# Patient Record
Sex: Male | Born: 1960 | Race: Black or African American | Hispanic: No | Marital: Single | Smoking: Current every day smoker
Health system: Southern US, Community
[De-identification: ages and names within clinical notes are randomized; demographics above are authoritative.]

## PROBLEM LIST (undated history)

## (undated) DIAGNOSIS — R569 Unspecified convulsions: Secondary | ICD-10-CM

## (undated) DIAGNOSIS — F1010 Alcohol abuse, uncomplicated: Secondary | ICD-10-CM

## (undated) HISTORY — DX: Alcohol abuse, uncomplicated: F10.10

## (undated) HISTORY — DX: Unspecified convulsions: R56.9

---

## 2002-05-29 ENCOUNTER — Encounter: Payer: Self-pay | Admitting: Emergency Medicine

## 2002-05-29 ENCOUNTER — Emergency Department (HOSPITAL_COMMUNITY): Admission: EM | Admit: 2002-05-29 | Discharge: 2002-05-29 | Payer: Self-pay | Admitting: Emergency Medicine

## 2010-09-04 ENCOUNTER — Emergency Department (HOSPITAL_COMMUNITY)
Admission: EM | Admit: 2010-09-04 | Discharge: 2010-09-04 | Payer: Self-pay | Source: Home / Self Care | Admitting: Emergency Medicine

## 2010-10-02 ENCOUNTER — Emergency Department (HOSPITAL_COMMUNITY)
Admission: EM | Admit: 2010-10-02 | Discharge: 2010-10-02 | Payer: Self-pay | Source: Home / Self Care | Admitting: Emergency Medicine

## 2010-10-02 LAB — DIFFERENTIAL
Basophils Absolute: 0.1 10*3/uL (ref 0.0–0.1)
Basophils Relative: 1 % (ref 0–1)
Eosinophils Absolute: 0.1 10*3/uL (ref 0.0–0.7)
Eosinophils Relative: 1 % (ref 0–5)
Lymphocytes Relative: 47 % — ABNORMAL HIGH (ref 12–46)
Lymphs Abs: 2.8 10*3/uL (ref 0.7–4.0)
Monocytes Absolute: 0.3 10*3/uL (ref 0.1–1.0)
Monocytes Relative: 4 % (ref 3–12)
Neutro Abs: 2.9 10*3/uL (ref 1.7–7.7)
Neutrophils Relative %: 47 % (ref 43–77)

## 2010-10-02 LAB — COMPREHENSIVE METABOLIC PANEL
AST: 55 U/L — ABNORMAL HIGH (ref 0–37)
Albumin: 4.2 g/dL (ref 3.5–5.2)
Alkaline Phosphatase: 64 U/L (ref 39–117)
BUN: 6 mg/dL (ref 6–23)
CO2: 23 mEq/L (ref 19–32)
Calcium: 8.8 mg/dL (ref 8.4–10.5)
Chloride: 106 mEq/L (ref 96–112)
Creatinine, Ser: 0.79 mg/dL (ref 0.4–1.5)
GFR calc Af Amer: >60 mL/min (ref >60)
Glucose, Bld: 94 mg/dL (ref 70–99)
Total Protein: 7.7 g/dL (ref 6.0–8.3)

## 2010-10-02 LAB — RPR: RPR Ser Ql: NONREACTIVE

## 2010-10-02 LAB — ETHANOL: Alcohol, Ethyl (B): 288 mg/dL — ABNORMAL HIGH (ref 0–10)

## 2010-10-02 LAB — CBC
HCT: 51.1 % (ref 39.0–52.0)
Hemoglobin: 18.5 g/dL — ABNORMAL HIGH (ref 13.0–17.0)
MCH: 37.1 pg — ABNORMAL HIGH (ref 26.0–34.0)
MCHC: 36.2 g/dL — ABNORMAL HIGH (ref 30.0–36.0)
Platelets: 152 10*3/uL (ref 150–400)
RBC: 4.98 MIL/uL (ref 4.22–5.81)
RDW: 13.6 % (ref 11.5–15.5)
WBC: 6 10*3/uL (ref 4.0–10.5)

## 2010-10-02 LAB — URINE MICROSCOPIC-ADD ON

## 2010-10-02 LAB — URINALYSIS, ROUTINE W REFLEX MICROSCOPIC
Bilirubin Urine: NEGATIVE
Specific Gravity, Urine: 1.012 (ref 1.005–1.030)
Urobilinogen, UA: 0.2 mg/dL (ref 0.0–1.0)

## 2010-10-02 LAB — RAPID URINE DRUG SCREEN, HOSP PERFORMED
Benzodiazepines: NOT DETECTED
Cocaine: NOT DETECTED

## 2012-01-06 ENCOUNTER — Encounter (HOSPITAL_COMMUNITY): Payer: Self-pay | Admitting: *Deleted

## 2012-01-06 ENCOUNTER — Emergency Department (HOSPITAL_COMMUNITY): Payer: Self-pay

## 2012-01-06 ENCOUNTER — Emergency Department (HOSPITAL_COMMUNITY)
Admission: EM | Admit: 2012-01-06 | Discharge: 2012-01-06 | Disposition: A | Discharge status: Home / Self Care | Payer: Self-pay | Attending: Emergency Medicine | Admitting: Emergency Medicine

## 2012-01-06 DIAGNOSIS — N453 Epididymo-orchitis: Secondary | ICD-10-CM | POA: Insufficient documentation

## 2012-01-06 DIAGNOSIS — S0003XA Contusion of scalp, initial encounter: Secondary | ICD-10-CM | POA: Insufficient documentation

## 2012-01-06 DIAGNOSIS — Y9210 Unspecified residential institution as the place of occurrence of the external cause: Secondary | ICD-10-CM | POA: Insufficient documentation

## 2012-01-06 DIAGNOSIS — F10929 Alcohol use, unspecified with intoxication, unspecified: Secondary | ICD-10-CM

## 2012-01-06 DIAGNOSIS — N433 Hydrocele, unspecified: Secondary | ICD-10-CM | POA: Insufficient documentation

## 2012-01-06 DIAGNOSIS — N5089 Other specified disorders of the male genital organs: Secondary | ICD-10-CM | POA: Insufficient documentation

## 2012-01-06 DIAGNOSIS — N451 Epididymitis: Secondary | ICD-10-CM

## 2012-01-06 DIAGNOSIS — W1809XA Striking against other object with subsequent fall, initial encounter: Secondary | ICD-10-CM | POA: Insufficient documentation

## 2012-01-06 DIAGNOSIS — F1010 Alcohol abuse, uncomplicated: Secondary | ICD-10-CM | POA: Insufficient documentation

## 2012-01-06 LAB — URINALYSIS, ROUTINE W REFLEX MICROSCOPIC
Bilirubin Urine: NEGATIVE
Glucose, UA: NEGATIVE mg/dL
Hgb urine dipstick: NEGATIVE
Specific Gravity, Urine: 1.005 (ref 1.005–1.030)
Urobilinogen, UA: 0.2 mg/dL (ref 0.0–1.0)
pH: 6 (ref 5.0–8.0)

## 2012-01-06 LAB — BASIC METABOLIC PANEL
BUN: 11 mg/dL (ref 6–23)
CO2: 19 mEq/L (ref 19–32)
Chloride: 105 mEq/L (ref 96–112)
Creatinine, Ser: 0.55 mg/dL (ref 0.50–1.35)
Potassium: 3.9 mEq/L (ref 3.5–5.1)

## 2012-01-06 LAB — DIFFERENTIAL
Lymphocytes Relative: 31 % (ref 12–46)
Lymphs Abs: 2.2 10*3/uL (ref 0.7–4.0)
Monocytes Absolute: 0.5 10*3/uL (ref 0.1–1.0)
Monocytes Relative: 7 % (ref 3–12)
Neutro Abs: 4.2 10*3/uL (ref 1.7–7.7)

## 2012-01-06 LAB — CBC
HCT: 43.8 % (ref 39.0–52.0)
Hemoglobin: 15.4 g/dL (ref 13.0–17.0)
RBC: 4.18 MIL/uL — ABNORMAL LOW (ref 4.22–5.81)
WBC: 7 10*3/uL (ref 4.0–10.5)

## 2012-01-06 MED ORDER — SODIUM CHLORIDE 0.9 % IV BOLUS (SEPSIS)
1000.0000 mL | Freq: Once | INTRAVENOUS | Status: AC
  Administered 2012-01-06: 1000 mL via INTRAVENOUS

## 2012-01-06 MED ORDER — CIPROFLOXACIN HCL 250 MG PO TABS: 500.0000 mg | ORAL_TABLET | Freq: Two times a day (BID) | ORAL | Status: AC

## 2012-01-06 MED ORDER — CEFTRIAXONE SODIUM 250 MG IJ SOLR
250.0000 mg | Freq: Once | INTRAMUSCULAR | Status: AC
  Administered 2012-01-06: 250 mg via INTRAMUSCULAR
  Filled 2012-01-06: qty 250

## 2012-01-06 NOTE — ED Notes (Signed)
Pt states he is ready to go home. He is attempting to get out of bed. Pt redirected to stay in bed. Pt talking in a pressured voice and is restless.

## 2012-01-06 NOTE — ED Notes (Signed)
Heather, PA, in room speaking with pt. Pt agrees to stay until d/c.

## 2012-01-06 NOTE — ED Notes (Signed)
Pt found lying on the floor next to door in room. Pt alert/oriented, no LOC, pt stated he got out of bed to urinate and fell. Boneta Lucks, RN and [place], [place] assisted to return pt to bed after pt assessed by PA. Pt placed on monitor and IV started. Raised area to left side of pt forehead above brow. Heather V, PA-C came to room to assess pt. Pt denied any pain, dizziness or discomfort.

## 2012-01-06 NOTE — ED Notes (Signed)
Pt given phone to make a phone call. He is on phone using profanity to the other party he is ready to go and he is leaving. Pt agrees to stay 1 hour longer with person on phone.

## 2012-01-06 NOTE — ED Provider Notes (Signed)
History     CSN: [id number]  Arrival date & time 01/06/12  1038   First MD Initiated Contact with Patient 01/06/12 1049      Chief Complaint  Patient presents with  . Groin Swelling    (Consider location/radiation/quality/duration/timing/severity/associated sxs/prior treatment) HPI Comments: Patient comes in today with a chief complaint of swelling of his left scrotum.  He reports that the swelling has been present for the past week and is becoming progressively worse.  He denies dysuria.  Denies fever or chills.  Denies testicular or scrotal pain. Denies any redness of the scrotum.   He reports that he has never had swelling like this before.  PMH significant for gonorrhea and chlamydia.  Patient reports that he is currently sexually active and has unprotected sex.  Patient also reports that he drank a lot of alcohol last evening.    The history is provided by the patient.    History reviewed. No pertinent past medical history.  History reviewed. No pertinent past surgical history.  History reviewed. No pertinent family history.  History  Substance Use Topics  . Smoking status: Current Everyday Smoker  . Smokeless tobacco: Not on file  . Alcohol Use: Yes      Review of Systems  Constitutional: Negative for fever and chills.  Eyes: Negative for visual disturbance.  Respiratory: Negative for shortness of breath.   Cardiovascular: Negative for chest pain.  Gastrointestinal: Negative for nausea and vomiting.  Genitourinary: Positive for scrotal swelling. Negative for dysuria, hematuria, decreased urine volume, penile swelling, penile pain and testicular pain.    Allergies  Milk-related compounds  Home Medications  No current outpatient prescriptions on file.  BP 108/72  Pulse 80  Resp 18  SpO2 98%  Physical Exam  Nursing note and vitals reviewed. Constitutional: He appears well-developed and well-nourished.  HENT:  Head:    Mouth/Throat: Oropharynx is clear  and moist.  Eyes: EOM are normal. Pupils are equal, round, and reactive to light.  Neck: Normal range of motion. Neck supple.  Cardiovascular: Normal rate, regular rhythm and normal heart sounds.   Pulmonary/Chest: Effort normal and breath sounds normal.  Abdominal: Hernia confirmed negative in the right inguinal area and confirmed negative in the left inguinal area.  Genitourinary: Penis normal. Right testis shows no mass, no swelling and no tenderness. Right testis is descended. Left testis shows swelling. Left testis shows no mass and no tenderness. Left testis is descended. Circumcised. No discharge found.       Diffuse left sided scrotal swelling  Musculoskeletal: Normal range of motion.  Lymphadenopathy:       Right: No inguinal adenopathy present.       Left: No inguinal adenopathy present.  Neurological: He is alert.  Skin: Skin is warm and dry.  Psychiatric: He has a normal mood and affect.    ED Course  Procedures (including critical care time)  Labs Reviewed  ETHANOL - Abnormal; Notable for the following:    Alcohol, Ethyl (B) 379 (*)    All other components within normal limits  URINALYSIS, ROUTINE W REFLEX MICROSCOPIC - Abnormal; Notable for the following:    Leukocytes, UA TRACE (*)    All other components within normal limits  CBC - Abnormal; Notable for the following:    RBC 4.18 (*)    MCV 104.8 (*)    MCH 36.8 (*)    All other components within normal limits  DIFFERENTIAL  BASIC METABOLIC PANEL  URINE MICROSCOPIC-ADD ON  GC/CHLAMYDIA  PROBE AMP, URINE   US Scrotum  01/06/2012  *RADIOLOGY REPORT*  Clinical Data:  Testicular pain and swelling  SCROTAL ULTRASOUND DOPPLER ULTRASOUND OF THE TESTICLES  Technique: Complete ultrasound examination of the testicles, epididymis, and other scrotal structures was performed.  Color and spectral Doppler ultrasound were also utilized to evaluate blood flow to the testicles.  Comparison:  None  Findings: Both testicles are normal  in size and echotexture with the right testicle measuring 4.1 x 2.1 x 2.9 cm and the left testicle measuring 3415 2.0 x 2.7 cm.  The left epididymis is hypervascular and edematous, consistent with epididymitis.  There is a 6 mm simple cyst on the right epididymis.  There is a moderate sized simple left hydrocele.  There is no evidence of varicocele bilaterally.  In the patient's region of palpable concern superior to the left testicle, there is a 1.8 cm oval solid mass which appears to be partially calcified.  The appearance is not consistent with the lymph node.  Pulsed Doppler interrogation of both testes demonstrates symmetric, low resistance flow bilaterally.  IMPRESSION:  There is no evidence of testicular torsion bilaterally.  Left epididymitis and moderate simple left hydrocele.  There is a 1.8 cm well defined oval, partially calcified nodule superior and anterior to the left testicle which correlates with the patient's area of palpable concern.  This is nonspecific but is likely an old granuloma.  Please correlate clinically and with patient history.  Original Report Authenticated By: Brandon Melnick, M.D.   [place] Art/ven Flow Abd Pelv Doppler  01/06/2012  *RADIOLOGY REPORT*  Clinical Data:  Testicular pain and swelling  SCROTAL ULTRASOUND DOPPLER ULTRASOUND OF THE TESTICLES  Technique: Complete ultrasound examination of the testicles, epididymis, and other scrotal structures was performed.  Color and spectral Doppler ultrasound were also utilized to evaluate blood flow to the testicles.  Comparison:  None  Findings: Both testicles are normal in size and echotexture with the right testicle measuring 4.1 x 2.1 x 2.9 cm and the left testicle measuring 3415 2.0 x 2.7 cm.  The left epididymis is hypervascular and edematous, consistent with epididymitis.  There is a 6 mm simple cyst on the right epididymis.  There is a moderate sized simple left hydrocele.  There is no evidence of varicocele bilaterally.  In the  patient's region of palpable concern superior to the left testicle, there is a 1.8 cm oval solid mass which appears to be partially calcified.  The appearance is not consistent with the lymph node.  Pulsed Doppler interrogation of both testes demonstrates symmetric, low resistance flow bilaterally.  IMPRESSION:  There is no evidence of testicular torsion bilaterally.  Left epididymitis and moderate simple left hydrocele.  There is a 1.8 cm well defined oval, partially calcified nodule superior and anterior to the left testicle which correlates with the patient's area of palpable concern.  This is nonspecific but is likely an old granuloma.  Please correlate clinically and with patient history.  Original Report Authenticated By: Brandon Melnick, M.D.     No diagnosis found.  Prior to my evaluation while patient was in the ED he fell and hit his head.  No LOC.  No vomiting.  Patient with PERRL and intact CN after the fall.  Small hematoma on the forehead.  Full ROM of all extremities.  Patient intoxicated.  Alcohol level 379.  Patient monitored for several hours after the fall.      MDM  Patient comes in today with a  chief complaint of left sided scrotal swelling.  U/S shows epididymitis and Hydrocele.  Patient given referral to Urology.  Patient with risks factors for STD's.  Patient given 250 mg Ceftriaxone IM while in ED.  Due to the increased cost of Doxycycline, the patient was not started on Doxy initially.  Patient was started on Cipro.  GC and Chlamydia pending.  Antibiotic will be switched to Doxycycline if GC and Chlamydia come back positive.  Patient discharged home in the care of his friend.  Friend drove patient home.        Pascal Lux [place], PA-C 01/07/12 1804  Pascal Lux [place], PA-C 01/07/12 1806
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
## 2012-01-06 NOTE — ED Notes (Signed)
Reports having swelling to testicles x 3 days, denies problems urinating. No acute distress noted at triage.

## 2012-01-06 NOTE — ED Notes (Addendum)
Pt in room. States he has "growth on left testicle" x3 days, burning with urination, states he had STD in past. Pt unsteady gait and slurred speech, smells of ETOH and states he has drank a 12 pack over the weekend. Last drink this AM. Pt drove himself to ED.

## 2012-01-07 LAB — GC/CHLAMYDIA PROBE AMP, URINE: Chlamydia, Swab/Urine, PCR: NEGATIVE

## 2012-01-08 NOTE — ED Provider Notes (Signed)
Medical screening examination/treatment/procedure(s) were conducted as a shared visit with non-physician practitioner(s) and myself.  I personally evaluated the patient during the encounter  Pt awake/alert, monitored in the ED without change in mental status, stable for d/c home   Joya Gaskins, MD 01/08/12 [phone]

## 2012-01-26 ENCOUNTER — Encounter (HOSPITAL_COMMUNITY): Payer: Self-pay

## 2012-01-26 ENCOUNTER — Emergency Department (HOSPITAL_COMMUNITY)
Admission: EM | Admit: 2012-01-26 | Discharge: 2012-01-26 | Disposition: A | Discharge status: Home / Self Care | Payer: Self-pay | Attending: Emergency Medicine | Admitting: Emergency Medicine

## 2012-01-26 DIAGNOSIS — H5789 Other specified disorders of eye and adnexa: Secondary | ICD-10-CM | POA: Insufficient documentation

## 2012-01-26 DIAGNOSIS — F17200 Nicotine dependence, unspecified, uncomplicated: Secondary | ICD-10-CM | POA: Insufficient documentation

## 2012-01-26 DIAGNOSIS — H109 Unspecified conjunctivitis: Secondary | ICD-10-CM

## 2012-01-26 MED ORDER — REFRESH P.M. OP OINT: TOPICAL_OINTMENT | OPHTHALMIC | Status: AC | PRN

## 2012-01-26 MED ORDER — FLUORESCEIN SODIUM 1 MG OP STRP
ORAL_STRIP | OPHTHALMIC | Status: AC
  Administered 2012-01-26: 10:00:00
  Filled 2012-01-26: qty 1

## 2012-01-26 MED ORDER — ERYTHROMYCIN 5 MG/GM OP OINT: TOPICAL_OINTMENT | OPHTHALMIC | Status: AC

## 2012-01-26 MED ORDER — TETRACAINE HCL 0.5 % OP SOLN
OPHTHALMIC | Status: AC
  Administered 2012-01-26: 10:00:00
  Filled 2012-01-26: qty 2

## 2012-01-26 NOTE — ED Notes (Signed)
Pt compalins of swelling and drainage to both eyes for 1 week

## 2012-01-26 NOTE — Discharge Instructions (Signed)
Conjunctivitis Conjunctivitis is commonly called "pink eye." Conjunctivitis can be caused by bacterial or viral infection, allergies, or injuries. There is usually redness of the lining of the eye, itching, discomfort, and sometimes discharge. There may be deposits of matter along the eyelids. A viral infection usually causes a watery discharge, while a bacterial infection causes a yellowish, thick discharge. Pink eye is very contagious and spreads by direct contact. You may be given antibiotic eyedrops as part of your treatment. Before using your eye medicine, remove all drainage from the eye by washing gently with warm water and cotton balls. Continue to use the medication until you have awakened 2 mornings in a row without discharge from the eye. Do not rub your eye. This increases the irritation and helps spread infection. Use separate towels from other household members. Wash your hands with soap and water before and after touching your eyes. Use cold compresses to reduce pain and sunglasses to relieve irritation from light. Do not wear contact lenses or wear eye makeup until the infection is gone. SEEK MEDICAL CARE IF:   Your symptoms are not better after 3 days of treatment.   You have increased pain or trouble seeing.   The outer eyelids become very red or swollen.  Document Released: 09/27/2004 Document Revised: 08/09/2011 Document Reviewed: 08/20/2005 [employer] Patient Information 2012 [employer]. 
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
## 2014-09-27 ENCOUNTER — Emergency Department (HOSPITAL_COMMUNITY): Payer: Self-pay

## 2014-09-27 ENCOUNTER — Encounter (HOSPITAL_COMMUNITY): Payer: Self-pay | Admitting: Emergency Medicine

## 2014-09-27 ENCOUNTER — Emergency Department (HOSPITAL_COMMUNITY)
Admission: EM | Admit: 2014-09-27 | Discharge: 2014-09-28 | Disposition: A | Discharge status: Home / Self Care | Payer: Self-pay | Attending: Emergency Medicine | Admitting: Emergency Medicine

## 2014-09-27 DIAGNOSIS — Z720 Tobacco use: Secondary | ICD-10-CM | POA: Insufficient documentation

## 2014-09-27 DIAGNOSIS — R55 Syncope and collapse: Secondary | ICD-10-CM | POA: Insufficient documentation

## 2014-09-27 LAB — CBC WITH DIFFERENTIAL/PLATELET
Basophils Absolute: 0 10*3/uL (ref 0.0–0.1)
Basophils Relative: 1 % (ref 0–1)
EOS ABS: 0.1 10*3/uL (ref 0.0–0.7)
EOS PCT: 1 % (ref 0–5)
HEMATOCRIT: 41.4 % (ref 39.0–52.0)
Hemoglobin: 14.4 g/dL (ref 13.0–17.0)
LYMPHS PCT: 41 % (ref 12–46)
Lymphs Abs: 2.3 10*3/uL (ref 0.7–4.0)
MCH: 35.6 pg — ABNORMAL HIGH (ref 26.0–34.0)
MCHC: 34.8 g/dL (ref 30.0–36.0)
MCV: 102.2 fL — AB (ref 78.0–100.0)
Monocytes Absolute: 0.2 10*3/uL (ref 0.1–1.0)
Monocytes Relative: 3 % (ref 3–12)
Neutro Abs: 3.1 10*3/uL (ref 1.7–7.7)
Neutrophils Relative %: 54 % (ref 43–77)
PLATELETS: 190 10*3/uL (ref 150–400)
RBC: 4.05 MIL/uL — ABNORMAL LOW (ref 4.22–5.81)
RDW: 13.5 % (ref 11.5–15.5)
WBC: 5.7 10*3/uL (ref 4.0–10.5)

## 2014-09-27 LAB — COMPREHENSIVE METABOLIC PANEL
ALBUMIN: 4.4 g/dL (ref 3.5–5.2)
ALT: 14 U/L (ref 0–53)
AST: 22 U/L (ref 0–37)
Alkaline Phosphatase: 48 U/L (ref 39–117)
Anion gap: 10 (ref 5–15)
BUN: <5 mg/dL — ABNORMAL LOW (ref 6–23)
CO2: 25 mmol/L (ref 19–32)
Calcium: 9 mg/dL (ref 8.4–10.5)
Chloride: 106 mmol/L (ref 96–112)
Creatinine, Ser: 0.74 mg/dL (ref 0.50–1.35)
GFR calc non Af Amer: >90 mL/min (ref >90)
GLUCOSE: 91 mg/dL (ref 70–99)
Potassium: 3.9 mmol/L (ref 3.5–5.1)
Sodium: 141 mmol/L (ref 135–145)
Total Bilirubin: 0.3 mg/dL (ref 0.3–1.2)
Total Protein: 7.9 g/dL (ref 6.0–8.3)

## 2014-09-27 MED ORDER — SODIUM CHLORIDE 0.9 % IV BOLUS (SEPSIS)
2000.0000 mL | Freq: Once | INTRAVENOUS | Status: AC
  Administered 2014-09-28: 2000 mL via INTRAVENOUS
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
## 2014-09-27 NOTE — ED Notes (Addendum)
Owner, Chad Golden requesting etoh level and to be contacted at [phone]

## 2014-09-28 DIAGNOSIS — W19XXXA Unspecified fall, initial encounter: Secondary | ICD-10-CM

## 2014-09-28 DIAGNOSIS — F10120 Alcohol abuse with intoxication, uncomplicated: Secondary | ICD-10-CM

## 2014-09-28 DIAGNOSIS — E860 Dehydration: Secondary | ICD-10-CM

## 2014-09-28 LAB — URINALYSIS, ROUTINE W REFLEX MICROSCOPIC
Bilirubin Urine: NEGATIVE
Glucose, UA: NEGATIVE mg/dL
Hgb urine dipstick: NEGATIVE
Ketones, ur: NEGATIVE mg/dL
Leukocytes, UA: NEGATIVE
Nitrite: NEGATIVE
PROTEIN: NEGATIVE mg/dL
Specific Gravity, Urine: 1.004 — ABNORMAL LOW (ref 1.005–1.030)
Urobilinogen, UA: 0.2 mg/dL (ref 0.0–1.0)
pH: 5 (ref 5.0–8.0)

## 2014-09-28 LAB — ETHANOL: Alcohol, Ethyl (B): 304 mg/dL — ABNORMAL HIGH (ref 0–9)

## 2014-09-28 LAB — RAPID URINE DRUG SCREEN, HOSP PERFORMED
Amphetamines: NOT DETECTED
Barbiturates: NOT DETECTED
Benzodiazepines: NOT DETECTED
COCAINE: NOT DETECTED
Opiates: NOT DETECTED
Tetrahydrocannabinol: NOT DETECTED

## 2014-09-28 MED ORDER — SODIUM CHLORIDE 0.9 % IV BOLUS (SEPSIS)
1000.0000 mL | Freq: Once | INTRAVENOUS | Status: AC
  Administered 2014-09-28: 1000 mL via INTRAVENOUS

## 2014-09-28 NOTE — ED Notes (Signed)
Reported bp 86/59 to Dr. Lita Mains. Received 2 L already completed. MD acknowledges, gives verbal order for 1L bolus.
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
## 2014-09-28 NOTE — Discharge Instructions (Signed)
Syncope °Syncope is a medical term for fainting or passing out. This means you lose consciousness and drop to the ground. People are generally unconscious for less than 5 minutes. You may have some muscle twitches for up to 15 seconds before waking up and returning to normal. Syncope occurs more often in older adults, but it can happen to anyone. While most causes of syncope are not dangerous, syncope can be a sign of a serious medical problem. It is important to seek medical care.  °CAUSES  °Syncope is caused by a sudden drop in blood flow to the brain. The specific cause is often not determined. Factors that can bring on syncope include: °· Taking medicines that lower blood pressure. °· Sudden changes in posture, such as standing up quickly. °· Taking more medicine than prescribed. °· Standing in one place for too long. °· Seizure disorders. °· Dehydration and excessive exposure to heat. °· Low blood sugar (hypoglycemia). °· Straining to have a bowel movement. °· Heart disease, irregular heartbeat, or other circulatory problems. °· Fear, emotional distress, seeing blood, or severe pain. °SYMPTOMS  °Right before fainting, you may: °· Feel dizzy or light-headed. °· Feel nauseous. °· See all white or all black in your field of vision. °· Have cold, clammy skin. °DIAGNOSIS  °Your health care provider will ask about your symptoms, perform a physical exam, and perform an electrocardiogram (ECG) to record the electrical activity of your heart. Your health care provider may also perform other heart or blood tests to determine the cause of your syncope which may include: °· Transthoracic echocardiogram (TTE). During echocardiography, sound waves are used to evaluate how blood flows through your heart. °· Transesophageal echocardiogram (TEE). °· Cardiac monitoring. This allows your health care provider to monitor your heart rate and rhythm in real time. °· Holter monitor. This is a portable device that records your  heartbeat and can help diagnose heart arrhythmias. It allows your health care provider to track your heart activity for several days, if needed. °· Stress tests by exercise or by giving medicine that makes the heart beat faster. °TREATMENT  °In most cases, no treatment is needed. Depending on the cause of your syncope, your health care provider may recommend changing or stopping some of your medicines. °HOME CARE INSTRUCTIONS °· Have someone stay with you until you feel stable. °· Do not drive, use machinery, or play sports until your health care provider says it is okay. °· Keep all follow-up appointments as directed by your health care provider. °· Lie down right away if you start feeling like you might faint. Breathe deeply and steadily. Wait until all the symptoms have passed. °· Drink enough fluids to keep your urine clear or pale yellow. °· If you are taking blood pressure or heart medicine, get up slowly and take several minutes to sit and then stand. This can reduce dizziness. °SEEK IMMEDIATE MEDICAL CARE IF:  °· You have a severe headache. °· You have unusual pain in the chest, abdomen, or back. °· You are bleeding from your mouth or rectum, or you have black or tarry stool. °· You have an irregular or very fast heartbeat. °· You have pain with breathing. °· You have repeated fainting or seizure-like jerking during an episode. °· You faint when sitting or lying down. °· You have confusion. °· You have trouble walking. °· You have severe weakness. °· You have vision problems. °If you fainted, call your local emergency services (911 in U.[place] Do not drive   yourself to the hospital.  MAKE SURE YOU:  Understand these instructions.  Will watch your condition.  Will get help right away if you are not doing well or get worse. Document Released: 08/20/2005 Document Revised: 08/25/2013 Document Reviewed: 10/19/2011 [facility] Patient Information 2015 [place], [place]. This information is not intended to replace  advice given to you by your health care provider. Make sure you discuss any questions you have with your health care provider.  Emergency Department Resource Guide 1) Find a Doctor and Pay Out of Pocket Although you won't have to find out who is covered by your insurance plan, it is a good idea to ask around and get recommendations. You will then need to call the office and see if the doctor you have chosen will accept you as a new patient and what types of options they offer for patients who are self-pay. Some doctors offer discounts or will set up payment plans for their patients who do not have insurance, but you will need to ask so you aren't surprised when you get to your appointment.  2) Contact Your Local Health Department Not all health departments have doctors that can see patients for sick visits, but many do, so it is worth a call to see if yours does. If you don't know where your local health department is, you can check in your phone book. The CDC also has a tool to help you locate your state's health department, and many state websites also have listings of all of their local health departments.  3) Find a [facility] If your illness is not likely to be very severe or complicated, you may want to try a walk in clinic. These are popping up all over the country in pharmacies, drugstores, and shopping centers. They're usually staffed by nurse practitioners or physician assistants that have been trained to treat common illnesses and complaints. They're usually fairly quick and inexpensive. However, if you have serious medical issues or chronic medical problems, these are probably not your best option.  No Primary Care Doctor: - Call Health Connect at  [phone] - they can help you locate a primary care doctor that  accepts your insurance, provides certain services, etc. - Physician Referral Service- [phone]  Chronic Pain Problems: Organization         Address  Phone    Notes  [facility]  [phone] Patients need to be referred by their primary care doctor.   Medication Assistance: Organization         Address  Phone   Notes  [facility] Medication [facility] [address] [place], [place] [address] [phone] --Must be a resident of [facility] -- Must have NO insurance coverage whatsoever (no Medicaid/ Medicare, etc.) -- The pt. MUST have a primary care doctor that directs their care regularly and follows them in the community   MedAssist  [phone]   [employer]  [phone]    Agencies that provide inexpensive medical care: Organization         Address  Phone   Notes  [place]  [phone]   [facility]    [phone]   [facility] [place], [place] [address] [phone]   [place] [address] [place] [phone]   Planned Parenthood    [phone]   [facility]    [phone]  [facility] and [place]  [facility] [address], [place] Phone:  [phone], Fax:  [phone] Hours of Operation:  9 am - 6 pm, M-F.  Also accepts Medicaid/Medicare and self-pay.  [facility] for [address], [address], [place] Phone: [phone], Fax: [phone]. Hours of Operation:  8:30 am - 5:30 pm, M-F.  Also accepts Medicaid and self-pay.  [facility] [place] [address], [place] Phone: [phone]   [place], [place], [place] [phone], Ext. [phone] Mondays & Thursdays: 7-9 AM.  First 15 patients are seen on a first come, first serve basis.    [place] Providers:  Organization         Address  Phone   Notes  [facility] [address], [place] [phone] Also accepts self-pay patients.  [facility]  [address], [place]  [phone]   [place], [address], [place] [phone]   [facility] [address] [place] [phone]   Lucianne Lei [address], [place]   [phone] Only accepts [place] Access [place] patients after they have their name applied to their card.   Self-Pay (no insurance) in [facility]:  Organization         Address  Phone   Notes  Sickle Cell Patients, [facility] [place] [phone]   [facility] [place] [phone]   [facility] [place]  [place], [place], [place] [phone]   [facility]/Dr. Osei-Bonsu  [address], [place] or [address], [place] [phone] Phone number for both [place] and [place] locations is the same.  Urgent Medical and [facility] [address], [place] [phone]   [facility] [address], [place] or [address] [phone]   [facility] [address], [place] [phone], phone; [phone], fax Sees patients 1st and 3rd Saturday of every month.  Must not qualify for public or private insurance (i.e. Medicaid, Medicare, [place] Health Choice, Veterans' Benefits)  Household income should be no more than 200% of the poverty level The clinic cannot treat you if you are pregnant or think you are pregnant  Sexually transmitted diseases are not treated at the clinic.    Dental Care: Organization         Address  Phone  Notes  [facility] Department of [facility] [place] [phone] Accepts children up to age 70 who are enrolled in [place] or [place]; pregnant women with a Medicaid card; and children who have  applied for Medicaid or [place] Health Choice, but were declined, whose parents can pay a reduced fee at time of service.  [facility] Department of [facility]  [address], [place] [phone] Accepts children up to age 72 who are enrolled in [place] or [place]; pregnant women with a Medicaid card; and children who have applied for Medicaid or [place] Health Choice, but were declined, whose parents can pay a reduced fee at time of service.  [place]  [phone]  [place] [phone] Patients are seen by appointment only. Walk-ins are not accepted. [place] will see patients 62 years of age and older. Monday - Tuesday (8am-5pm) Most Wednesdays (8:30-5pm) $30 per visit, cash only  [facility] Adult Dental Access PROGRAM  [address], [facility] [phone] Patients are seen by appointment only. Walk-ins are not accepted. [place] will see patients 40 years of age and older. One Wednesday Evening (Monthly: Volunteer Based).  $30 per visit, cash only  [place]  [phone] for adults; Children under age 33, call Graduate Pediatric Dentistry at [phone]. Children aged 75-14, please call [phone] to request a pediatric application.  Dental services are provided in all areas of dental care including fillings, crowns and bridges, complete and partial dentures, implants, gum treatment, root canals, and extractions. Preventive care is also provided. Treatment is provided to both adults and children. Patients are selected via a lottery and there is often a waiting list.   [facility] [address], [place]  [phone] [email or web address]   [facility] [address] [place], [place] [phone], Ext. [phone] Second and Fourth Thursday of each month, opens at 6:30 AM; Clinic ends at 9 AM.  Patients are seen on a first-come first-served basis, and a  limited number are seen during each clinic.   [facility]  [address] Hillard Danker [place], [place] [phone]   Eligibility Requirements You must have lived in [place], [place], or [place] counties for at least the last three months.   You cannot be eligible for state or federal sponsored [employer], including [employer], [place], or [employer].   You generally cannot be eligible for healthcare insurance through your employer.    How to apply: Eligibility screenings are held every Tuesday and Wednesday afternoon from 1:00 pm until 4:00 pm. You do not need an appointment for the interview!  [facility] [address], [place], [place]   Ewa Gentry  [place] Department  [place]  [phone]    Behavioral Health Resources in the Community: Intensive Outpatient Programs Organization         Address  Phone  Notes  [place]. [address], [place], [place] [phone]   [facility]patient [address], [place], [place]   ADS: Alcohol & Drug Svcs [address] [place], [place]   [place] [address]  [place], [place] or [phone]   Substance Abuse Resources Organization         Address  Phone  Notes  Alcohol and Drug Services  7328334124   [place]  [phone]   [facility] [place]   Chinita Pester  [phone]   Residential & Outpatient Substance Abuse Program  [phone]   Psychological Services Organization         Address  Phone  Notes  [facility] [place]  [place]  [phone]   [place] [address], [place] or [phone]    Mobile Crisis Teams Organization          Address  Phone  Notes  [facility], Mobile Crisis Care Unit  [phone]   [facility] Psychotherapeutic Services  [address] [place], [place]  [facility] [address] [place] [phone]    Self-Help/Support Groups Organization         Address  Phone             Notes  [facility]ental [facility] of [place] - variety of support groups  [place] Call for more information  Narcotics Anonymous (NA), [facility] [address], [employer] Story  2 meetings at this location   Special educational needs teacher         Address  Phone  Notes  ASAP Residential Treatment [place],    [place]  [phone]   [facility]  [address] [place] [phone], [place], [place]   [place], [place] [phone] Admissions: 8am-3pm M-F  Incentives Substance [place] [address]    [place], [place] [phone]   [facility] [address] [place], [place], [place]   [facility] [address]  [place], [place]   [facility]ight Programs - Intensive Outpatient [address], [place], [place]   [facility] ([place].,  [place], [place] [phone] or [phone]   [facility] Services (RTS) [address], [place], [facility] Accepts Medicaid  [facility] [place] [address]  [place] [phone] Substance Abuse/Addiction Treatment   [facility] Organization         Address  Phone  Notes  [facility]  [phone]   Domenic Schwab, PhD [address] Arlis Porta [place], [place]   [phone] or [phone]   [facility] [place], [place] [phone]   [facility] [address], [place], [place] [phone] Insurance/Medicaid/sponsorship  through [facility] and Families [address], [address]                                    [place], [place] [phone] [place] [address], [place] [phone]    Dr. Adele Schilder  [phone]   [facility] Dept. 1) [address], [place] 2) [place] 3)  [address], [place] [phone]  [phone]   [place] [phone] or [phone] (After Hours)

## 2014-09-28 NOTE — ED Notes (Signed)
Admitting at bedside 

## 2014-10-01 ENCOUNTER — Emergency Department (HOSPITAL_COMMUNITY)
Admission: EM | Admit: 2014-10-01 | Discharge: 2014-10-01 | Discharge status: Against Medical Advice | Payer: Self-pay | Attending: Emergency Medicine | Admitting: Emergency Medicine

## 2014-10-01 ENCOUNTER — Encounter (HOSPITAL_COMMUNITY): Payer: Self-pay

## 2014-10-01 ENCOUNTER — Emergency Department (HOSPITAL_COMMUNITY): Payer: Self-pay

## 2014-10-01 DIAGNOSIS — R42 Dizziness and giddiness: Secondary | ICD-10-CM | POA: Insufficient documentation

## 2014-10-01 DIAGNOSIS — Z720 Tobacco use: Secondary | ICD-10-CM | POA: Insufficient documentation

## 2014-10-01 DIAGNOSIS — I959 Hypotension, unspecified: Secondary | ICD-10-CM | POA: Insufficient documentation

## 2014-10-01 LAB — BASIC METABOLIC PANEL
ANION GAP: 10 (ref 5–15)
BUN: 10 mg/dL (ref 6–23)
CALCIUM: 9.9 mg/dL (ref 8.4–10.5)
CO2: 25 mmol/L (ref 19–32)
Chloride: 99 mmol/L (ref 96–112)
Creatinine, Ser: 0.83 mg/dL (ref 0.50–1.35)
GFR calc non Af Amer: >90 mL/min (ref >90)
Glucose, Bld: 148 mg/dL — ABNORMAL HIGH (ref 70–99)
Potassium: 4.1 mmol/L (ref 3.5–5.1)
Sodium: 134 mmol/L — ABNORMAL LOW (ref 135–145)

## 2014-10-01 LAB — I-STAT TROPONIN, ED: Troponin i, poc: 0 ng/mL (ref 0.00–0.08)

## 2014-10-01 LAB — CBC
HEMATOCRIT: 42.2 % (ref 39.0–52.0)
Hemoglobin: 15 g/dL (ref 13.0–17.0)
MCH: 35.8 pg — AB (ref 26.0–34.0)
MCHC: 35.5 g/dL (ref 30.0–36.0)
MCV: 100.7 fL — ABNORMAL HIGH (ref 78.0–100.0)
Platelets: 201 10*3/uL (ref 150–400)
RBC: 4.19 MIL/uL — ABNORMAL LOW (ref 4.22–5.81)
RDW: 13.7 % (ref 11.5–15.5)
WBC: 4.8 10*3/uL (ref 4.0–10.5)

## 2014-10-01 NOTE — ED Notes (Signed)
No answer x3

## 2014-10-01 NOTE — ED Notes (Signed)
No answer

## 2014-10-01 NOTE — ED Notes (Signed)
Pt pacing in lobby, no difficulty with gait noted.

## 2014-10-01 NOTE — ED Notes (Signed)
Pt states he thinks he might be having issues with his blood pressure still being low. BP 620 systolic in triage. Pt reports some lightheadedness in triage also since waking up this morning but also having it when he was here on the 26th. Denies any pain. No Nausea or vomiting.

## 2014-10-06 NOTE — ED Provider Notes (Signed)
Patient left without being seen.   Wandra Arthurs, MD 10/06/14 [phone]

## 2014-12-31 ENCOUNTER — Emergency Department (HOSPITAL_COMMUNITY): Payer: No Typology Code available for payment source

## 2014-12-31 ENCOUNTER — Encounter (HOSPITAL_COMMUNITY): Payer: Self-pay | Admitting: Emergency Medicine

## 2014-12-31 ENCOUNTER — Emergency Department (HOSPITAL_COMMUNITY)
Admission: EM | Admit: 2014-12-31 | Discharge: 2014-12-31 | Disposition: A | Discharge status: Home / Self Care | Payer: No Typology Code available for payment source | Attending: Emergency Medicine | Admitting: Emergency Medicine

## 2014-12-31 DIAGNOSIS — S2232XA Fracture of one rib, left side, initial encounter for closed fracture: Secondary | ICD-10-CM

## 2014-12-31 DIAGNOSIS — Y998 Other external cause status: Secondary | ICD-10-CM | POA: Insufficient documentation

## 2014-12-31 DIAGNOSIS — S3991XA Unspecified injury of abdomen, initial encounter: Secondary | ICD-10-CM | POA: Diagnosis not present

## 2014-12-31 DIAGNOSIS — Z720 Tobacco use: Secondary | ICD-10-CM | POA: Diagnosis not present

## 2014-12-31 DIAGNOSIS — Y9389 Activity, other specified: Secondary | ICD-10-CM | POA: Insufficient documentation

## 2014-12-31 DIAGNOSIS — Y92410 Unspecified street and highway as the place of occurrence of the external cause: Secondary | ICD-10-CM | POA: Diagnosis not present

## 2014-12-31 DIAGNOSIS — S2242XA Multiple fractures of ribs, left side, initial encounter for closed fracture: Secondary | ICD-10-CM | POA: Insufficient documentation

## 2014-12-31 DIAGNOSIS — S29001A Unspecified injury of muscle and tendon of front wall of thorax, initial encounter: Secondary | ICD-10-CM | POA: Diagnosis present

## 2014-12-31 LAB — URINALYSIS, ROUTINE W REFLEX MICROSCOPIC
Bilirubin Urine: NEGATIVE
GLUCOSE, UA: NEGATIVE mg/dL
Hgb urine dipstick: NEGATIVE
Ketones, ur: 15 mg/dL — AB
Leukocytes, UA: NEGATIVE
NITRITE: NEGATIVE
Protein, ur: NEGATIVE mg/dL
SPECIFIC GRAVITY, URINE: 1.02 (ref 1.005–1.030)
Urobilinogen, UA: 1 mg/dL (ref 0.0–1.0)
pH: 6.5 (ref 5.0–8.0)

## 2014-12-31 LAB — I-STAT CHEM 8, ED
BUN: 6 mg/dL (ref 6–23)
CHLORIDE: 101 mmol/L (ref 96–112)
Calcium, Ion: 1.19 mmol/L (ref 1.12–1.23)
Creatinine, Ser: 1.1 mg/dL (ref 0.50–1.35)
Glucose, Bld: 91 mg/dL (ref 70–99)
HCT: 48 % (ref 39.0–52.0)
HEMOGLOBIN: 16.3 g/dL (ref 13.0–17.0)
Potassium: 3.9 mmol/L (ref 3.5–5.1)
SODIUM: 139 mmol/L (ref 135–145)
TCO2: 24 mmol/L (ref 0–100)

## 2014-12-31 MED ORDER — ONDANSETRON HCL 4 MG/2ML IJ SOLN
4.0000 mg | Freq: Once | INTRAMUSCULAR | Status: AC
  Administered 2014-12-31: 4 mg via INTRAVENOUS
  Filled 2014-12-31: qty 2

## 2014-12-31 MED ORDER — MORPHINE SULFATE 4 MG/ML IJ SOLN
4.0000 mg | Freq: Once | INTRAMUSCULAR | Status: AC
  Administered 2014-12-31: 4 mg via INTRAVENOUS
  Filled 2014-12-31: qty 1

## 2014-12-31 MED ORDER — OXYCODONE-ACETAMINOPHEN 5-325 MG PO TABS: 1.0000 | ORAL_TABLET | ORAL | Status: DC | PRN

## 2014-12-31 MED ORDER — IOHEXOL 300 MG/ML  SOLN
100.0000 mL | Freq: Once | INTRAMUSCULAR | Status: AC | PRN
  Administered 2014-12-31: 100 mL via INTRAVENOUS

## 2014-12-31 MED ORDER — IBUPROFEN 800 MG PO TABS: 800.0000 mg | ORAL_TABLET | Freq: Three times a day (TID) | ORAL | Status: DC | PRN

## 2014-12-31 MED ORDER — CYCLOBENZAPRINE HCL 10 MG PO TABS
5.0000 mg | ORAL_TABLET | Freq: Once | ORAL | Status: AC
  Administered 2014-12-31: 5 mg via ORAL
  Filled 2014-12-31: qty 1
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
## 2014-12-31 NOTE — ED Notes (Signed)
This RN gave patient incentive spirometer and instructed how to use it. Pt demonstrated correct use.

## 2014-12-31 NOTE — ED Notes (Signed)
Pt A&OX4, ambulatory at d/c with steady gait, NAD 
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
## 2014-12-31 NOTE — ED Notes (Signed)
Pt given incentive spirometer and instructed patient on how to use. Pt demonstrated correct use.

## 2016-11-07 ENCOUNTER — Encounter (HOSPITAL_COMMUNITY): Payer: Self-pay | Admitting: *Deleted

## 2016-11-07 ENCOUNTER — Ambulatory Visit (HOSPITAL_COMMUNITY)
Admission: EM | Admit: 2016-11-07 | Discharge: 2016-11-07 | Disposition: A | Discharge status: Home / Self Care | Payer: BLUE CROSS/BLUE SHIELD | Attending: Family Medicine | Admitting: Family Medicine

## 2016-11-07 DIAGNOSIS — G540 Brachial plexus disorders: Secondary | ICD-10-CM

## 2016-11-07 MED ORDER — IBUPROFEN 800 MG PO TABS: 800.0000 mg | ORAL_TABLET | Freq: Three times a day (TID) | ORAL | 0 refills | Status: DC | PRN

## 2016-11-07 NOTE — ED Provider Notes (Signed)
CSN: [id number]     Arrival date & time 11/07/16  1001 History   First MD Initiated Contact with Patient 11/07/16 1030     Chief Complaint  Patient presents with  . Tingling   (Consider location/radiation/quality/duration/timing/severity/associated sxs/prior Treatment) Patient c/o numbness and tingling in right lateral biceps and entire hand and fingers.  He has been having sx's for about a week.  He has been carrying a back pack on right shoulder.  Patient states he has been taking some advil otc as directed.   The history is provided by the patient.  Muscle Pain  This is a new problem. The current episode started more than 1 week ago. The problem occurs constantly. The problem has not changed since onset.Nothing aggravates the symptoms. Nothing relieves the symptoms.    History reviewed. No pertinent past medical history. History reviewed. No pertinent surgical history. History reviewed. No pertinent family history. Social History  Substance Use Topics  . Smoking status: Current Every Day Smoker  . Smokeless tobacco: Not on file  . Alcohol use Yes    Review of Systems  Constitutional: Negative.   HENT: Negative.   Eyes: Negative.   Respiratory: Negative.   Cardiovascular: Negative.   Gastrointestinal: Negative.   Endocrine: Negative.   Genitourinary: Negative.   Musculoskeletal: Positive for arthralgias.  Allergic/Immunologic: Negative.   Neurological: Positive for numbness.  Hematological: Negative.   Psychiatric/Behavioral: Negative.     Allergies  Milk-related compounds  Home Medications   Prior to Admission medications   Medication Sig Start Date End Date Taking? Authorizing Provider  ibuprofen (ADVIL,MOTRIN) 800 MG tablet Take 1 tablet (800 mg total) by mouth every 8 (eight) hours as needed for mild pain or moderate pain. 11/07/16   Lysbeth Penner, FNP  oxyCODONE-acetaminophen (PERCOCET/ROXICET) 5-325 MG per tablet Take 1-2 tablets by mouth every 4 (four) hours  as needed for moderate pain or severe pain. 12/31/14   Clayton Bibles, PA-C  Tetrahydrozoline HCl (VISINE OP) Apply 1 drop to eye daily as needed (red eyes).    Historical Provider, MD   Meds Ordered and Administered this Visit  Medications - No data to display  BP 124/76 (BP Location: Right Arm)   Pulse 78   Temp 98.6 F (37 C) (Oral)   Resp 18   SpO2 100%  No data found.   Physical Exam  Constitutional: He appears well-developed and well-nourished.  HENT:  Head: Normocephalic and atraumatic.  Right Ear: External ear normal.  Left Ear: External ear normal.  Mouth/Throat: Oropharynx is clear and moist.  Eyes: Conjunctivae and EOM are normal. Pupils are equal, round, and reactive to light.  Neck: Normal range of motion. Neck supple.  Cardiovascular: Normal rate, regular rhythm and normal heart sounds.   Diminished right radial pulse when raising right arm above head.  Pulmonary/Chest: Effort normal.  Musculoskeletal: He exhibits tenderness.  TTP right shoulder.  Patient c/o increased numbness and tingling when raising right arm above his head.  Nursing note and vitals reviewed.   Urgent Care Course     Procedures (including critical care time)  Labs Review Labs Reviewed - No data to display  Imaging Review No results found.   Visual Acuity Review  Right Eye Distance:   Left Eye Distance:   Bilateral Distance:    Right Eye Near:   Left Eye Near:    Bilateral Near:         MDM   1. Thoracic outlet syndrome    Motrin 800mg   one po tid x 10 days #30  Advised patient to take prilosec otc prn if having any GI sx's.    Follow up prn.      Lysbeth Penner, FNP 11/07/16 [phone]

## 2016-11-07 NOTE — ED Triage Notes (Addendum)
Pt reports    Symptoms     Of  Tingling  And  Numbness     To  His r  Arm  That  Started about  1  Week ago     Hand  Grips  Moderate  Pt is  A  Smoker     Pt is  Awake and  Alert  And  Oriented   Denies  Any  Injury

## 2016-11-16 ENCOUNTER — Ambulatory Visit (HOSPITAL_COMMUNITY)
Admission: EM | Admit: 2016-11-16 | Discharge: 2016-11-16 | Disposition: A | Discharge status: Home / Self Care | Payer: BLUE CROSS/BLUE SHIELD | Attending: Internal Medicine | Admitting: Internal Medicine

## 2016-11-16 ENCOUNTER — Encounter (HOSPITAL_COMMUNITY): Payer: Self-pay | Admitting: Emergency Medicine

## 2016-11-16 ENCOUNTER — Ambulatory Visit (INDEPENDENT_AMBULATORY_CARE_PROVIDER_SITE_OTHER): Payer: BLUE CROSS/BLUE SHIELD

## 2016-11-16 DIAGNOSIS — R202 Paresthesia of skin: Secondary | ICD-10-CM

## 2016-11-16 DIAGNOSIS — M5030 Other cervical disc degeneration, unspecified cervical region: Secondary | ICD-10-CM

## 2016-11-16 DIAGNOSIS — D170 Benign lipomatous neoplasm of skin and subcutaneous tissue of head, face and neck: Secondary | ICD-10-CM

## 2016-11-16 DIAGNOSIS — R200 Anesthesia of skin: Secondary | ICD-10-CM

## 2016-11-16 MED ORDER — PREDNISONE 10 MG (21) PO TBPK: ORAL_TABLET | Freq: Every day | ORAL | 0 refills | Status: DC

## 2016-11-16 NOTE — ED Triage Notes (Signed)
The patient presented to the [facility] for a refill on his ibuprofen for his right arm pain that was evaluated here.

## 2016-11-16 NOTE — ED Provider Notes (Signed)
CSN: [id number]     Arrival date & time 11/16/16  1339 History   None    Chief Complaint  Patient presents with  . Medication Refill   (Consider location/radiation/quality/duration/timing/severity/associated sxs/prior Treatment) Patient is here for follow up on his right arm and hand numbness.  He has been having numbness and tingling in his right upper extremity.  He took 10 days of ibuprofen w/o improvement.  He was diagnosed with thoracic outlet syndrome.   The history is provided by the patient.  Medication Refill  Medications/supplies requested:  Ibuprofen Reason for request:  Medications ran out Medications taken before: yes - see home medications     History reviewed. No pertinent past medical history. History reviewed. No pertinent surgical history. History reviewed. No pertinent family history. Social History  Substance Use Topics  . Smoking status: Current Every Day Smoker  . Smokeless tobacco: Not on file  . Alcohol use Yes    Review of Systems  Constitutional: Negative.   HENT: Negative.   Eyes: Negative.   Respiratory: Negative.   Cardiovascular: Negative.   Gastrointestinal: Negative.  Negative for abdominal distention.  Endocrine: Negative.   Genitourinary: Negative.   Musculoskeletal: Positive for arthralgias.  Allergic/Immunologic: Negative.   Neurological: Negative.   Hematological: Negative.   Psychiatric/Behavioral: Negative.     Allergies  Milk-related compounds  Home Medications   Prior to Admission medications   Medication Sig Start Date End Date Taking? Authorizing Provider  ibuprofen (ADVIL,MOTRIN) 800 MG tablet Take 1 tablet (800 mg total) by mouth every 8 (eight) hours as needed for mild pain or moderate pain. 11/07/16  Yes Lysbeth Penner, FNP  predniSONE (STERAPRED UNI-PAK 21 TAB) 10 MG (21) TBPK tablet Take by mouth daily. Take 6 tabs by mouth daily  for 2 days, then 5 tabs for 2 days, then 4 tabs for 2 days, then 3 tabs for 2 days, 2  tabs for 2 days, then 1 tab by mouth daily for 2 days 11/16/16   Lysbeth Penner, FNP   Meds Ordered and Administered this Visit  Medications - No data to display  BP 119/76 (BP Location: Right Arm)   Pulse 64   Temp 98.4 F (36.9 C) (Oral)   Resp 16   SpO2 98%  No data found.   Physical Exam  Constitutional: He appears well-developed and well-nourished.  HENT:  Head: Normocephalic and atraumatic.  Eyes: Conjunctivae and EOM are normal. Pupils are equal, round, and reactive to light.  Neck: Normal range of motion. Neck supple.  Cardiovascular: Normal rate, regular rhythm and normal heart sounds.   Pulmonary/Chest: Effort normal and breath sounds normal.  Nursing note and vitals reviewed.   Urgent Care Course     Procedures (including critical care time)  Labs Review Labs Reviewed - No data to display  Imaging Review Dg Cervical Spine Complete  Result Date: 11/16/2016 CLINICAL DATA:  56 year old male with right arm numbness. Tingling in the right fingers. No known injury. Initial encounter. EXAM: CERVICAL SPINE - COMPLETE 4+ VIEW COMPARISON:  Head CT 09/27/2014. FINDINGS: Normal prevertebral soft tissue contour. Mild straightening of upper cervical lordosis. Cervicothoracic junction alignment is within normal limits. Bilateral posterior element alignment is within normal limits. Normal C1-C2 alignment. Mild levoconvex curvature of the cervical spine. Mild to moderate disc space loss from C4-C5 to C6-C7 with moderate associated endplate spurring. Negative lung apices. Calcified carotid atherosclerosis greater on the left. There is an abnormal soft tissue contour along the right neck near  the expected level of the right carotid bifurcation which projects over the larynx on the lateral view (arrow). There are questionable tiny surgical clips surrounding this area. IMPRESSION: 1. Query palpable right neck mass (image 4). And query also previous right carotid endarterectomy. Neck CT (IV  contrast preferred) could evaluate further as necessary. 2. No acute osseous abnormality in the cervical spine chronic C4-C5, C5-C6, and C6-C7 disc and endplate degeneration. Electronically Signed   By: Genevie Ann M.D.   On: 11/16/2016 15:13     Visual Acuity Review  Right Eye Distance:   Left Eye Distance:   Bilateral Distance:    Right Eye Near:   Left Eye Near:    Bilateral Near:         MDM   1. Numbness and tingling in right hand   2. DDD (degenerative disc disease), cervical   3. Lipoma of neck    Sterapred DS dose pack #42  Explained this is probably coming from his cervical spine. Follow up with PCP.    Lysbeth Penner, FNP 11/16/16 [phone]

## 2016-12-07 ENCOUNTER — Ambulatory Visit (INDEPENDENT_AMBULATORY_CARE_PROVIDER_SITE_OTHER): Payer: BLUE CROSS/BLUE SHIELD | Admitting: Family

## 2016-12-07 ENCOUNTER — Encounter: Payer: Self-pay | Admitting: Family

## 2016-12-07 VITALS — BP 112/80 | HR 84 | Temp 98.5°F | Resp 16 | Ht 66.0 in | Wt 135.0 lb

## 2016-12-07 DIAGNOSIS — M5030 Other cervical disc degeneration, unspecified cervical region: Secondary | ICD-10-CM | POA: Insufficient documentation

## 2016-12-07 MED ORDER — MELOXICAM 15 MG PO TABS: 7.5000 mg | ORAL_TABLET | Freq: Every day | ORAL | 0 refills | Status: DC

## 2016-12-07 NOTE — Patient Instructions (Addendum)
Thank you for choosing [facility].  SUMMARY AND INSTRUCTIONS:  Ice to your neck and wrist x 20 minutes every 2 hours and after activity.  Stretches multiple times throughout the day.  They will call with your referral to orthopedics.  Ensure good posture and ergonomics when using computers and at school.   Medication:  Your prescription(s) have been submitted to your pharmacy or been printed and provided for you. Please take as directed and contact our office if you believe you are having problem(s) with the medication(s) or have any questions.  Follow up:  If your symptoms worsen or fail to improve, please contact our office for further instruction, or in case of emergency go directly to the emergency room at the closest medical facility.    Cervical Strain and Sprain Rehab Ask your health care provider which exercises are safe for you. Do exercises exactly as told by your health care provider and adjust them as directed. It is normal to feel mild stretching, pulling, tightness, or discomfort as you do these exercises, but you should stop right away if you feel sudden pain or your pain gets worse.Do not begin these exercises until told by your health care provider. Stretching and range of motion exercises These exercises warm up your muscles and joints and improve the movement and flexibility of your neck. These exercises also help to relieve pain, numbness, and tingling. Exercise A: Cervical side bend   1. Using good posture, sit on a stable chair or stand up. 2. Without moving your shoulders, slowly tilt your left / right ear to your shoulder until you feel a stretch in your neck muscles. You should be looking straight ahead. 3. Hold for __________ seconds. 4. Repeat with the other side of your neck. Repeat __________ times. Complete this exercise __________ times a day. Exercise B: Cervical rotation   1. Using good posture, sit on a stable chair or stand up. 2. Slowly  turn your head to the side as if you are looking over your left / right shoulder.  Keep your eyes level with the ground.  Stop when you feel a stretch along the side and the back of your neck. 3. Hold for __________ seconds. 4. Repeat this by turning to your other side. Repeat __________ times. Complete this exercise __________ times a day. Exercise C: Thoracic extension and pectoral stretch  1. Roll a towel or a small blanket so it is about 4 inches (10 cm) in diameter. 2. Lie down on your back on a firm surface. 3. Put the towel lengthwise, under your spine in the middle of your back. It should not be not under your shoulder blades. The towel should line up with your spine from your middle back to your lower back. 4. Put your hands behind your head and let your elbows fall out to your sides. 5. Hold for __________ seconds. Repeat __________ times. Complete this exercise __________ times a day. Strengthening exercises These exercises build strength and endurance in your neck. Endurance is the ability to use your muscles for a long time, even after your muscles get tired. Exercise D: Upper cervical flexion, isometric  1. Lie on your back with a thin pillow behind your head and a small rolled-up towel under your neck. 2. Gently tuck your chin toward your chest and nod your head down to look toward your feet. Do not lift your head off the pillow. 3. Hold for __________ seconds. 4. Release the tension slowly. Relax your neck  muscles completely before you repeat this exercise. Repeat __________ times. Complete this exercise __________ times a day. Exercise E: Cervical extension, isometric   1. Stand about 6 inches (15 cm) away from a wall, with your back facing the wall. 2. Place a soft object, about 6-8 inches (15-20 cm) in diameter, between the back of your head and the wall. A soft object could be a small pillow, a ball, or a folded towel. 3. Gently tilt your head back and press into the  soft object. Keep your jaw and forehead relaxed. 4. Hold for __________ seconds. 5. Release the tension slowly. Relax your neck muscles completely before you repeat this exercise. Repeat __________ times. Complete this exercise __________ times a day. Posture and body mechanics   Body mechanics refers to the movements and positions of your body while you do your daily activities. Posture is part of body mechanics. Good posture and healthy body mechanics can help to relieve stress in your body's tissues and joints. Good posture means that your spine is in its natural S-curve position (your spine is neutral), your shoulders are pulled back slightly, and your head is not tipped forward. The following are general guidelines for applying improved posture and body mechanics to your everyday activities. Standing   When standing, keep your spine neutral and keep your feet about hip-width apart. Keep a slight bend in your knees. Your ears, shoulders, and hips should line up.  When you do a task in which you stand in one place for a long time, place one foot up on a stable object that is 2-4 inches (5-10 cm) high, such as a footstool. This helps keep your spine neutral. Sitting    When sitting, keep your spine neutral and your keep feet flat on the floor. Use a footrest, if necessary, and keep your thighs parallel to the floor. Avoid rounding your shoulders, and avoid tilting your head forward.  When working at a desk or a computer, keep your desk at a height where your hands are slightly lower than your elbows. Slide your chair under your desk so you are close enough to maintain good posture.  When working at a computer, place your monitor at a height where you are looking straight ahead and you do not have to tilt your head forward or downward to look at the screen. Resting  When lying down and resting, avoid positions that are most painful for you. Try to support your neck in a neutral position. You  can use a contour pillow or a small rolled-up towel. Your pillow should support your neck but not push on it. This information is not intended to replace advice given to you by your health care provider. Make sure you discuss any questions you have with your health care provider. Document Released: 08/20/2005 Document Revised: 04/26/2016 Document Reviewed: 07/27/2015 Elsevier Interactive Patient Education  2017 [employer].

## 2016-12-07 NOTE — Progress Notes (Signed)
Subjective:    Patient ID: Chad Golden, male    DOB: Jul 25, 1961, 56 y.o.   MRN: [id number]  Chief Complaint  Patient presents with  . Establish Care    tingling in right arm and hand     HPI:  Chad Golden is a 56 y.o. male who  has a past medical history of Alcohol abuse and Seizures ([place]). and presents today for an office visit to establish care.  This is a new problem. Associated symptoms of numbness and tingling located in his right upper extremity primarily his bicep has been going on for about 1 month. Currently taking classes for computer programming and denies any trauma or injury that he can recall. No neck pain. Modifying factors include ibuprofen and prednisone that he received from an urgent care. X-rays reviewed showing disc and endplate degeneration at multiple levels of the cervical spine with no acute bony process.  Reports taking the medications and have very minimal improvements at the completion of the course. No weakness. Able to complete his activities of daily living. Feelings are not continuous but have gradually worsened since initial onset. No other areas of pain or numbness/tingling. There was concern for thoracic outlet syndrome in the ED.    Allergies  Allergen Reactions  . Milk-Related Compounds Nausea And Vomiting      Outpatient Medications Prior to Visit  Medication Sig Dispense Refill  . ibuprofen (ADVIL,MOTRIN) 800 MG tablet Take 1 tablet (800 mg total) by mouth every 8 (eight) hours as needed for mild pain or moderate pain. 30 tablet 0  . predniSONE (STERAPRED UNI-PAK 21 TAB) 10 MG (21) TBPK tablet Take by mouth daily. Take 6 tabs by mouth daily  for 2 days, then 5 tabs for 2 days, then 4 tabs for 2 days, then 3 tabs for 2 days, 2 tabs for 2 days, then 1 tab by mouth daily for 2 days 42 tablet 0   No facility-administered medications prior to visit.      Past Medical History:  Diagnosis Date  . Alcohol abuse   . Seizures ([place])    None  in the last 15 years      History reviewed. No pertinent surgical history.    Family History  Problem Relation Age of Onset  . Healthy Mother   . Alcohol abuse Father       Social History   Social History  . Marital status: Single    Spouse name: N/A  . Number of children: 0  . Years of education: 12   Occupational History  . Student     Investment banker, corporate   Social History Main Topics  . Smoking status: Current Every Day Smoker    Packs/day: 0.50    Years: 35.00    Types: Cigarettes  . Smokeless tobacco: Never Used  . Alcohol use No     Comment: Sober for 2 years  . Drug use: No  . Sexual activity: Not on file   Other Topics Concern  . Not on file   Social History Narrative  . No narrative on file      Review of Systems  Constitutional: Negative for chills and fever.  Respiratory: Negative for chest tightness and shortness of breath.   Cardiovascular: Negative for chest pain.  Musculoskeletal: Negative for back pain, neck pain and neck stiffness.  Neurological: Positive for numbness. Negative for weakness.       Objective:    BP 112/80 (BP Location: Left Arm,  Patient Position: Sitting, Cuff Size: Normal)   Pulse 84   Temp 98.5 F (36.9 C) (Oral)   Resp 16   Ht 5\' 6"  (1.676 m)   Wt 135 lb (61.2 kg)   SpO2 97%   BMI 21.79 kg/m  Nursing note and vital signs reviewed.  Physical Exam  Constitutional: He is oriented to person, place, and time. He appears well-developed and well-nourished. No distress.  Neck:  No obvious deformity, discoloration or edema. No palpable tenderness able to be elicited. Range of motion decreased in right lateral bending and rotation. Negative cervical compression test. Distal pulses are intact and appropriate. Negative Phalen's test and Tinel's sign.   Cardiovascular: Normal rate, regular rhythm, normal heart sounds and intact distal pulses.   Pulmonary/Chest: Effort normal and breath sounds normal.  Neurological:  He is alert and oriented to person, place, and time.  Skin: Skin is warm and dry.  Psychiatric: He has a normal mood and affect. His behavior is normal. Judgment and thought content normal.        Assessment & Plan:   Problem List Items Addressed This Visit      Musculoskeletal and Integument   Degenerative disc disease, cervical - Primary    Symptoms and exam appear mostly consistent with cervical degenerative disc disease with concern for radiculopathy. Start Mobic. Recommend ice/moist heat and home exercise therapy. Referral to orthopedics placed per patient request. Recommend physical therapy which patient declined at this time.       Relevant Medications   meloxicam (MOBIC) 15 MG tablet   Other Relevant Orders   AMB referral to orthopedics       I have discontinued [name] ibuprofen and predniSONE. I am also having him start on meloxicam.   Meds ordered this encounter  Medications  . meloxicam (MOBIC) 15 MG tablet    Sig: Take 0.5-1 tablets (7.5-15 mg total) by mouth daily.    Dispense:  30 tablet    Refill:  0    Order Specific Question:   Supervising Provider    Answer:   Pricilla Holm A [[id number]]     Follow-up: Return in about 1 month (around 01/06/2017), or if symptoms worsen or fail to improve.  Mauricio Po, FNP

## 2016-12-07 NOTE — Assessment & Plan Note (Signed)
Symptoms and exam appear mostly consistent with cervical degenerative disc disease with concern for radiculopathy. Start Mobic. Recommend ice/moist heat and home exercise therapy. Referral to orthopedics placed per patient request. Recommend physical therapy which patient declined at this time.

## 2017-01-29 ENCOUNTER — Ambulatory Visit (INDEPENDENT_AMBULATORY_CARE_PROVIDER_SITE_OTHER): Payer: BLUE CROSS/BLUE SHIELD | Admitting: Orthopaedic Surgery

## 2017-01-29 ENCOUNTER — Encounter (INDEPENDENT_AMBULATORY_CARE_PROVIDER_SITE_OTHER): Payer: Self-pay | Admitting: Orthopaedic Surgery

## 2017-01-29 VITALS — BP 109/65 | HR 85 | Ht 66.0 in | Wt 135.0 lb

## 2017-01-29 DIAGNOSIS — M5030 Other cervical disc degeneration, unspecified cervical region: Secondary | ICD-10-CM | POA: Diagnosis not present

## 2017-01-29 NOTE — Progress Notes (Signed)
**Note Chad-Identified via Obfuscation** Office Visit Note/orthopedic consultation   Patient: Chad Chad           Date of Birth: 06-02-1961           MRN: [id number] Visit Date: 01/29/2017              Requested by: Chad Chad, Chad Golden, Chad Chad [id number] PCP: Chad Chad   Assessment & Plan: Visit Diagnoses:  1. Degenerative disc disease, cervical   2.    Right side subtendinous neck mass, possible sebaceous cyst.  Plan: We'll proceed with a cervical MRI scan to evaluate for spondylosis in his right arm radiculopathy. Thank you for the opportunity to see me in consultation  Follow-Up Instructions: No Follow-up on file.   Orders:  No orders of the defined types were placed in this encounter.  No orders of the defined types were placed in this encounter.     Procedures: No procedures performed   Clinical Data: No additional findings.   Subjective: Chief Complaint  Patient presents with  . Neck - Pain    HPI patient had 3 month history of neck and right arm pain with hand numbness. He states it got slightly better in the last 3 months. He's been on ibuprofen, moment, prednisone 10 mg pack with 42 tablets. He also has subcutaneous mass on the right side of his neck that been present for greater than a year. Patient states neck pain was the most severe about 1 month ago. It bothers him when he is entering down on a computer and currently is in school and is starting back again tomorrow. Increased problems of his neck is in flexion. No fever chills no lower extremity associated symptoms. No previous history of neck surgery. He has had a CT scan showed some callus case of the carotid arteries.  Review of Systems 14 part review of systems positive for history of alcohol abuse he states currently he does not drink. Past history of seizures. Carotid calcification seen on previous CT scan. Otherwise negative as it pertains to his history of present illness.   Objective: Vital  Signs: BP 109/65   Pulse 85   Ht 5\' 6"  (1.676 m)   Wt 135 lb (61.2 kg)   BMI 21.79 kg/m   Physical Exam  Constitutional: He is oriented to person, place, and time. He appears well-developed and well-nourished.  HENT:  Head: Normocephalic and atraumatic.  Eyes: EOM are normal. Pupils are equal, round, and reactive to light.  Neck: No tracheal deviation present. No thyromegaly present.  Cardiovascular: Normal rate.   Pulmonary/Chest: Effort normal. He has no wheezes.  Abdominal: Soft. Bowel sounds are normal.  Musculoskeletal:  Patient's right complex tenderness worse on the right than left. Positive Spurling the right knee over me. Subcutaneous 3 x 2 cm oval subcutaneous mass noted in the neck anterior to the sternocleidomastoid muscle. Nontender no erythema. M agree reflexes show absent biceps reflex both right and left. Triceps brachioradialis are 2+. No biceps atrophy no impingement of the shoulder. No myeloma range of motion. No thenar or hyperthenar atrophy. Normal capillary refill good grip strength. Pulses at the wrist are normal.  Neurological: He is alert and oriented to person, place, and time.  Skin: Skin is warm and dry. Capillary refill takes less than 2 seconds.  Psychiatric: He has a normal mood and affect. His behavior is normal. Judgment and thought content normal.    Ortho Exam normal heel toe  gait he gets on and off the exam table easily. No isolated motor weakness. No lower extremity hyperreflexia.  Specialty Comments:  No specialty comments available.  Imaging: CLINICAL DATA:  56 year old male with right arm numbness. Tingling in the right fingers. No known injury. Initial encounter.  EXAM: CERVICAL SPINE - COMPLETE 4+ VIEW  COMPARISON:  Head CT 09/27/2014.  FINDINGS: Normal prevertebral soft tissue contour. Mild straightening of upper cervical lordosis. Cervicothoracic junction alignment is within normal limits. Bilateral posterior element alignment is  within normal limits. Normal C1-C2 alignment. Mild levoconvex curvature of the cervical spine. Mild to moderate disc space loss from C4-C5 to C6-C7 with moderate associated endplate spurring.  Negative lung apices. Calcified carotid atherosclerosis greater on the left. There is an abnormal soft tissue contour along the right neck near the expected level of the right carotid bifurcation which projects over the larynx on the lateral view (arrow). There are questionable tiny surgical clips surrounding this area.  IMPRESSION: 1. Query palpable right neck mass (image 4). And query also previous right carotid endarterectomy. Neck CT (IV contrast preferred) could evaluate further as necessary. 2. No acute osseous abnormality in the cervical spine chronic C4-C5, C5-C6, and C6-C7 disc and endplate degeneration.   Electronically Signed   By: Chad Chad M.D.   On: 11/16/2016 15:13    PMFS History: Patient Active Problem List   Diagnosis Date Noted  . Degenerative disc disease, cervical 12/07/2016   Past Medical History:  Diagnosis Date  . Alcohol abuse   . Seizures ([place])    None in the last 15 years    Family History  Problem Relation Age of Onset  . Healthy Mother   . Alcohol abuse Father     No past surgical history on file. Social History   Occupational History  . Student     Investment banker, corporate   Social History Main Topics  . Smoking status: Current Every Day Smoker    Packs/day: 0.50    Years: 35.00    Types: Cigarettes  . Smokeless tobacco: Never Used  . Alcohol use No     Comment: Sober for 2 years  . Drug use: No  . Sexual activity: Not on file

## 2017-01-29 NOTE — Addendum Note (Signed)
Addended by: Meyer Cory on: 01/29/2017 10:09 AM   Modules accepted: Orders

## 2017-02-04 ENCOUNTER — Ambulatory Visit
Admission: RE | Admit: 2017-02-04 | Discharge: 2017-02-04 | Disposition: A | Discharge status: Home / Self Care | Payer: BLUE CROSS/BLUE SHIELD | Source: Ambulatory Visit | Attending: Orthopaedic Surgery | Admitting: Orthopaedic Surgery

## 2017-02-04 DIAGNOSIS — M5030 Other cervical disc degeneration, unspecified cervical region: Secondary | ICD-10-CM

## 2017-02-26 ENCOUNTER — Encounter (INDEPENDENT_AMBULATORY_CARE_PROVIDER_SITE_OTHER): Payer: Self-pay | Admitting: Orthopaedic Surgery

## 2017-02-26 ENCOUNTER — Ambulatory Visit (INDEPENDENT_AMBULATORY_CARE_PROVIDER_SITE_OTHER): Payer: BLUE CROSS/BLUE SHIELD | Admitting: Orthopaedic Surgery

## 2017-02-26 VITALS — BP 98/71 | HR 85 | Ht 66.0 in | Wt 135.0 lb

## 2017-02-26 DIAGNOSIS — M4802 Spinal stenosis, cervical region: Secondary | ICD-10-CM

## 2017-02-26 NOTE — Progress Notes (Signed)
Office Visit Note   Patient: Chad Golden           Date of Birth: 10-03-1960           MRN: [id number] Visit Date: 02/26/2017              Requested by: [name], [place], [place] [id number] PCP: [name], FNP   Assessment & Plan: Visit Diagnoses:  1. Spinal stenosis of cervical region     Plan: Patient has significant cervical spinal stenosis with narrowing of the canal down to 6.5 mm at C4-5, C5-6 and C6-7 with sparing of below the levels. He has loss of anterior CSF line unfortunately does not have any myelomalacia on the cord. We reviewed the images and gave him a copy of the report. I discussed with them that he has severe spinal stenosis with compression of the cord and by foraminal stenosis causing his symptoms. Treatment will require 3 level cervical discectomy with allograft and plate fixation. He understands that there is risks of pseudoarthrosis. We discussed using a collar after surgery for least 6 weeks. Risks of dysphagia and dysphonia discussed. The cyst in the right side of his neck which is subcutaneous could be addressed at a later date. Incision for his cervical stenosis ability on the left side of his neck and if the right side was also adressed with subcutaneous nodule he may have some postop dyspnea problems.. . He have the cyst removed a few months later as an outpatient procedure P so desired. Wrists first 3 level cervical fusion include pseudoarthrosis with posterior fusion being required if he had asymptomatic pseudoarthrosis. Plan overnight stay in hospital. Questions were elicited and answered he he can call if he like to proceed.  Follow-Up Instructions: No Follow-up on file.   Orders:  No orders of the defined types were placed in this encounter.  No orders of the defined types were placed in this encounter.     Procedures: No procedures performed   Clinical Data: No additional findings.   Subjective: Chief  Complaint  Patient presents with  . Neck - Pain, Follow-up    HPI 56 year old male returns with ongoing problems with the neck pain and four-month history of the right hand numbness with the weakness. Patient placed on a prednisone pack states he is a more than 50% improved with the current time. He still has some numbness in the family states it's the less severe since the prednisone pack. He's had an MRI scan which is available for review. He does have a subcutaneous cyst right side of his neck which was not mentioned radiologist readout but does visualize in the subcutaneous tissue with the fluid type signal present. Patient denies any gait disturbance problems.  Review of Systems 14 point review of systems updated past history of alcohol abuse, seizures. Chronic escalation a previous CT scan. Neck and the right arm pain with right arm weakness he does not note any weakness in his left arm.   Objective: Vital Signs: BP 98/71   Pulse 85   Ht 5\' 6"  (1.676 m)   Wt 135 lb (61.2 kg)   BMI 21.79 kg/m   Physical Exam  Constitutional: He is oriented to person, place, and time. He appears well-developed and well-nourished.  HENT:  Head: Normocephalic and atraumatic.  Eyes: EOM are normal. Pupils are equal, round, and reactive to light.  Neck: No tracheal deviation present. No thyromegaly present.  Cardiovascular: Normal  rate.   Pulmonary/Chest: Effort normal. He has no wheezes.  Abdominal: Soft. Bowel sounds are normal.  Neurological: He is alert and oriented to person, place, and time.  Skin: Skin is warm and dry. Capillary refill takes less than 2 seconds.  Psychiatric: He has a normal mood and affect. His behavior is normal. Judgment and thought content normal.    Ortho Exam positive Spurling on the right side of his neck negative on the left. 3 x 2 cm subtendinous mass on the right side of his neck just anterior to this SCM muscle. It is nontender. Reflexes show absent bicep reflex  right and left. Triceps and brachioradialis are 2+. No biceps or triceps atrophy. No clonus in the lower extremities. Nothing up thenar atrophy. Normal supination pronation. Normal heel toe gait.  Specialty Comments:  No specialty comments available.  Imaging: Show images for MR Cervical Spine w/o contrast  Study Result   CLINICAL DATA:  Neck pain, right arm pain and numbness, 3 months duration.  EXAM: MRI CERVICAL SPINE WITHOUT CONTRAST  TECHNIQUE: Multiplanar, multisequence MR imaging of the cervical spine was performed. No intravenous contrast was administered.  COMPARISON:  Radiography 11/16/2016  FINDINGS: Alignment: Straightening of the normal cervical lordosis.  Vertebrae: No fracture or primary bone lesion.  Cord: No primary cord lesion.  See below regarding cord indentation.  Posterior Fossa, vertebral arteries, paraspinal tissues: Negative  Disc levels:  Foramen magnum, C1-2, C2-3 and C3-4: Unremarkable. Wide patency of the canal and foramina.  C4-5: Spondylosis with endplate osteophytes and protruding disc material. Narrowing of the canal with AP diameter of 6.5 mm. Effacement of the subarachnoid space and indentation of the cord. Bilateral foraminal narrowing right more than left.  C5-6: Spondylosis with endplate osteophytes and protruding disc material. AP diameter of the canal 6.5 mm. Effacement of the subarachnoid space and indentation of the cord. Bilateral neural foraminal stenosis.  C6-7: Spondylosis with endplate osteophytes and protruding disc material. AP diameter of the canal only 6.5 mm. Effacement the subarachnoid space with indentation of the cord. Bilateral foraminal stenosis.  C7-T1:  Normal interspace.  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 Endplate osteophytes and disc protrusions at each level. AP diameter of the canal 6.5 mm at each level. Effacement of the subarachnoid space with indentation  of the cord head each level. Foraminal stenosis bilaterally at each level that could cause neural compression.   Electronically Signed   By: Nelson Chimes M.D.   On: 02/04/2017 07:48      PMFS History: Patient Active Problem List   Diagnosis Date Noted  . Degenerative disc disease, cervical 12/07/2016   Past Medical History:  Diagnosis Date  . Alcohol abuse   . Seizures ([place])    None in the last 15 years    Family History  Problem Relation Age of Onset  . Healthy Mother   . Alcohol abuse Father     No past surgical history on file. Social History   Occupational History  . Student     Investment banker, corporate   Social History Main Topics  . Smoking status: Current Every Day Smoker    Packs/day: 0.50    Years: 35.00    Types: Cigarettes  . Smokeless tobacco: Never Used  . Alcohol use No     Comment: Sober for 2 years  . Drug use: No  . Sexual activity: Not on file

## 2018-09-22 IMAGING — DX DG CERVICAL SPINE COMPLETE 4+V
5 series · 5 of 5 positions shown · non-contrast
Comparison: Head CT 09/27/2014.

CLINICAL DATA: 55-year-old male with right arm numbness. Tingling
in the right fingers. No known injury. Initial encounter.

EXAM:
CERVICAL SPINE - COMPLETE 4+ VIEW

[c-spine lat]
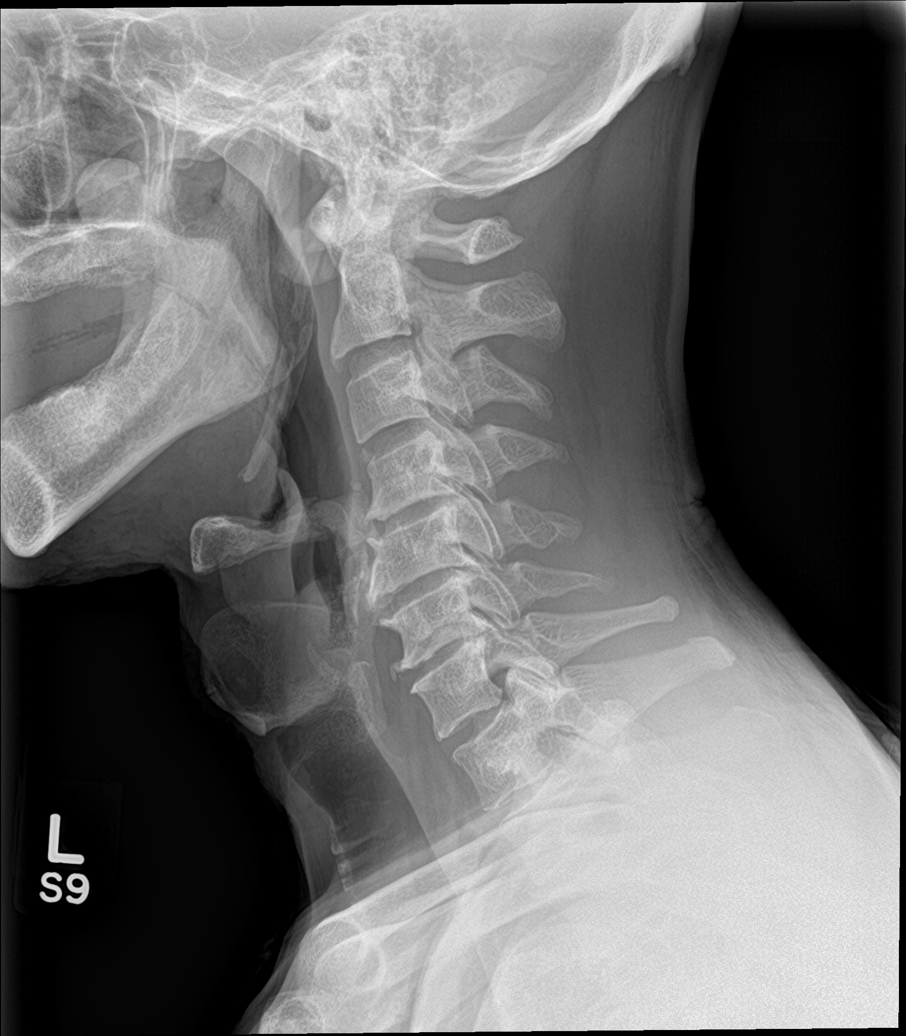

[c-spine obl (1 of 2)]
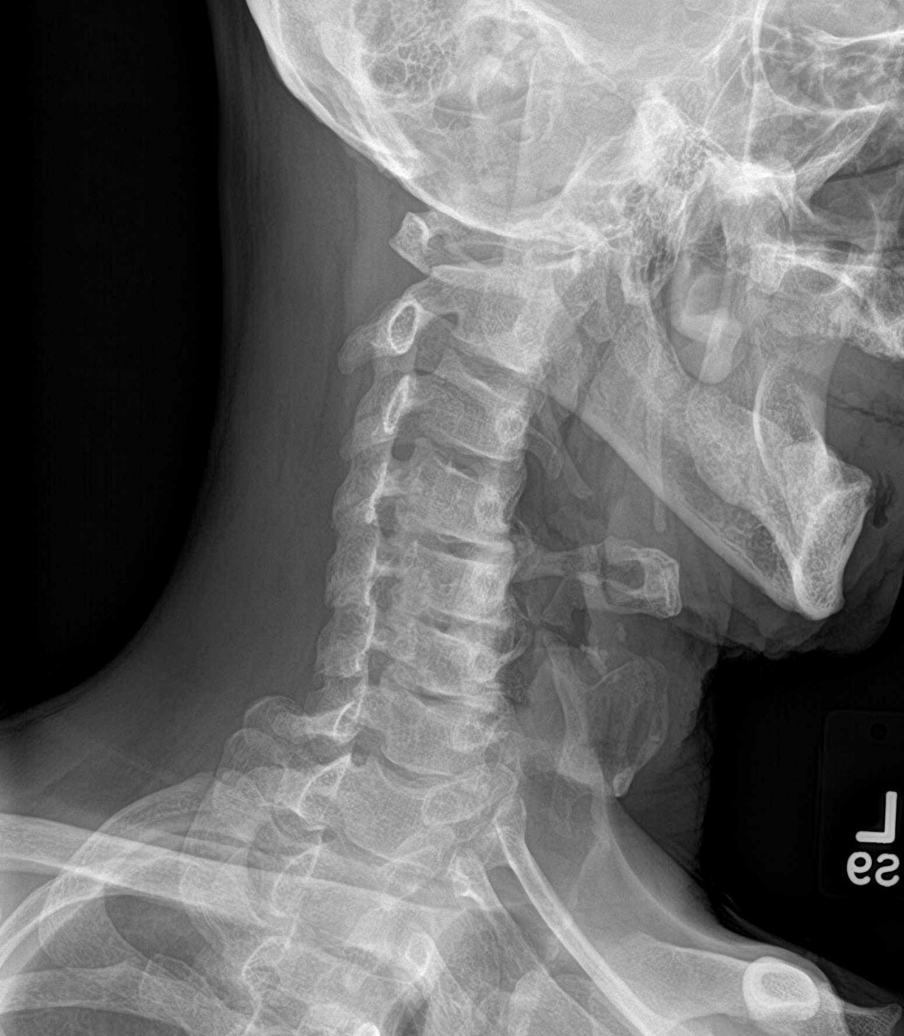

[c-spine obl (2 of 2)]
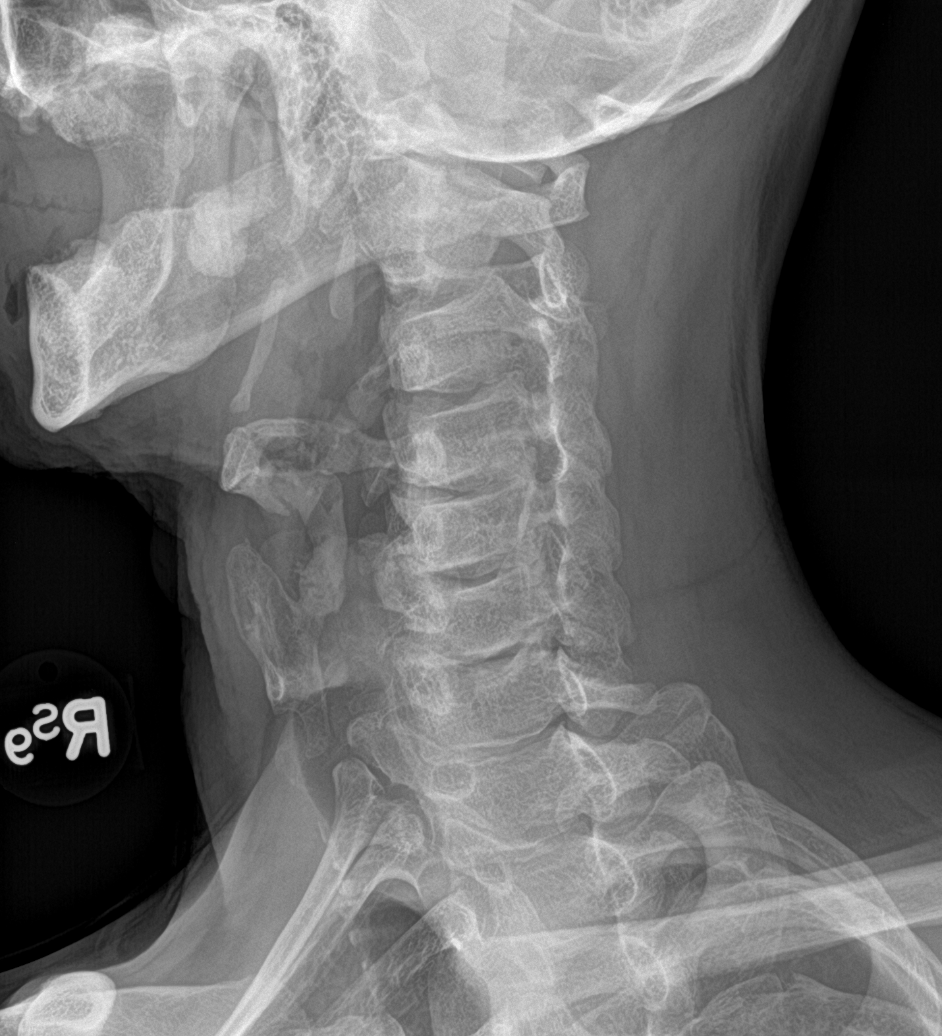

[c-spine ap]
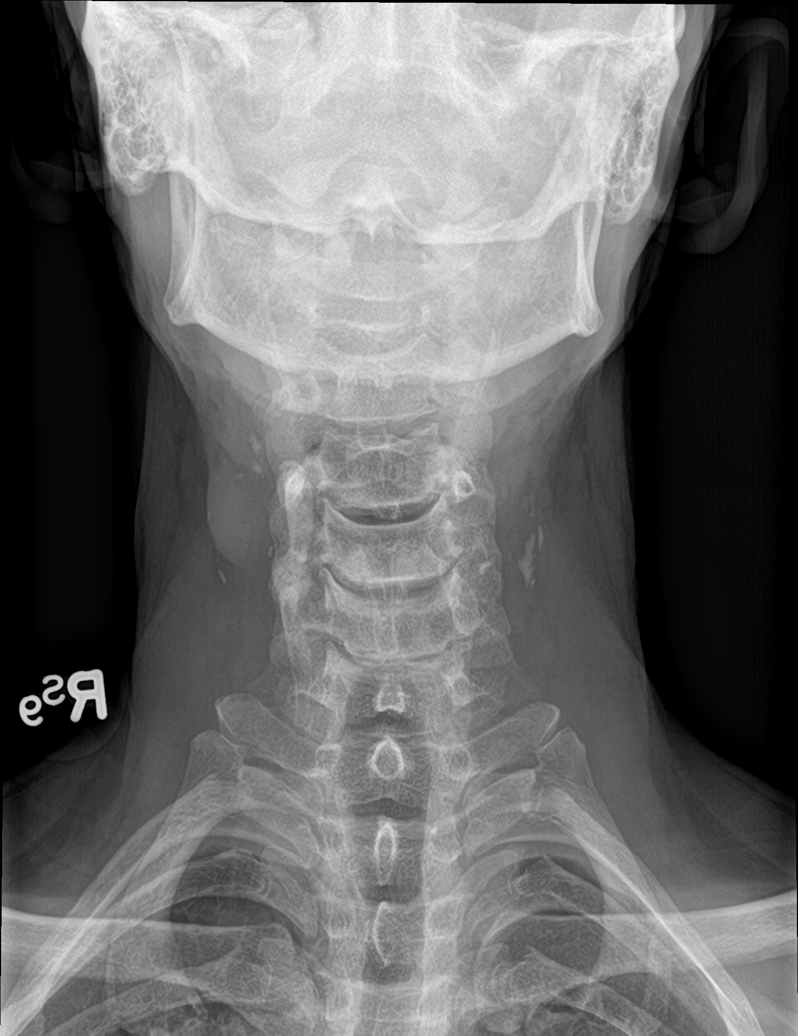

[c-spine open mouth]
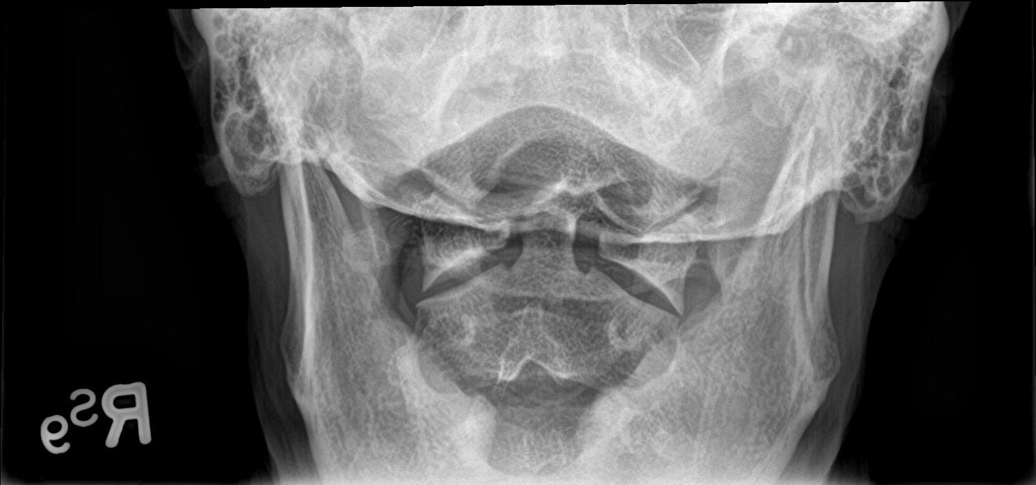

[5 of 5 positions shown; findings below may reference images not displayed]

FINDINGS: Normal prevertebral soft tissue contour. Mild straightening of upper
cervical lordosis. Cervicothoracic junction alignment is within
normal limits. Bilateral posterior element alignment is within
normal limits. Normal C1-C2 alignment. Mild levoconvex curvature of
the cervical spine. Mild to moderate disc space loss from C4-C5 to
C6-C7 with moderate associated endplate spurring.

Negative lung apices. Calcified carotid atherosclerosis greater on
the left. There is an abnormal soft tissue contour along the right
neck near the expected level of the right carotid bifurcation which
projects over the larynx on the lateral view (arrow). There are
questionable tiny surgical clips surrounding this area.
IMPRESSION: 1. Query palpable right neck mass (image 4). And query also previous
right carotid endarterectomy. Neck CT (IV contrast preferred) could
evaluate further as necessary.
2. No acute osseous abnormality in the cervical spine chronic C4-C5,
C5-C6, and C6-C7 disc and endplate degeneration.

## 2022-03-08 ENCOUNTER — Ambulatory Visit (HOSPITAL_COMMUNITY)
Admission: EM | Admit: 2022-03-08 | Discharge: 2022-03-08 | Disposition: A | Discharge status: Home / Self Care | Payer: 59

## 2022-03-08 ENCOUNTER — Encounter (HOSPITAL_COMMUNITY): Payer: Self-pay | Admitting: *Deleted

## 2022-03-08 ENCOUNTER — Encounter (HOSPITAL_COMMUNITY): Payer: Self-pay

## 2022-03-08 ENCOUNTER — Other Ambulatory Visit: Payer: Self-pay

## 2022-03-08 ENCOUNTER — Emergency Department (HOSPITAL_COMMUNITY)
Admission: EM | Admit: 2022-03-08 | Discharge: 2022-03-08 | Disposition: A | Discharge status: Home / Self Care | Payer: 59 | Attending: Emergency Medicine | Admitting: Emergency Medicine

## 2022-03-08 DIAGNOSIS — R55 Syncope and collapse: Secondary | ICD-10-CM | POA: Diagnosis present

## 2022-03-08 DIAGNOSIS — D696 Thrombocytopenia, unspecified: Secondary | ICD-10-CM | POA: Diagnosis not present

## 2022-03-08 DIAGNOSIS — R040 Epistaxis: Secondary | ICD-10-CM | POA: Insufficient documentation

## 2022-03-08 LAB — COMPREHENSIVE METABOLIC PANEL
ALT: 31 U/L (ref 0–44)
AST: 49 U/L — ABNORMAL HIGH (ref 15–41)
Albumin: 3.8 g/dL (ref 3.5–5.0)
Alkaline Phosphatase: 59 U/L (ref 38–126)
Anion gap: 15 (ref 5–15)
BUN: 10 mg/dL (ref 8–23)
CO2: 17 mmol/L — ABNORMAL LOW (ref 22–32)
Calcium: 8.9 mg/dL (ref 8.9–10.3)
Chloride: 102 mmol/L (ref 98–111)
Creatinine, Ser: 1.01 mg/dL (ref 0.61–1.24)
GFR, Estimated: >60 mL/min (ref >60)
Glucose, Bld: 126 mg/dL — ABNORMAL HIGH (ref 70–99)
Potassium: 3.8 mmol/L (ref 3.5–5.1)
Sodium: 134 mmol/L — ABNORMAL LOW (ref 135–145)
Total Bilirubin: 0.7 mg/dL (ref 0.3–1.2)
Total Protein: 7.6 g/dL (ref 6.5–8.1)

## 2022-03-08 LAB — CBC WITH DIFFERENTIAL/PLATELET
Abs Immature Granulocytes: 0.06 10*3/uL (ref 0.00–0.07)
Basophils Absolute: 0.1 10*3/uL (ref 0.0–0.1)
Basophils Relative: 1 %
Eosinophils Absolute: 0.1 10*3/uL (ref 0.0–0.5)
Eosinophils Relative: 1 %
HCT: 45.6 % (ref 39.0–52.0)
Hemoglobin: 15.7 g/dL (ref 13.0–17.0)
Immature Granulocytes: 1 %
Lymphocytes Relative: 28 %
Lymphs Abs: 2.7 10*3/uL (ref 0.7–4.0)
MCH: 36.2 pg — ABNORMAL HIGH (ref 26.0–34.0)
MCHC: 34.4 g/dL (ref 30.0–36.0)
MCV: 105.1 fL — ABNORMAL HIGH (ref 80.0–100.0)
Monocytes Absolute: 0.8 10*3/uL (ref 0.1–1.0)
Monocytes Relative: 8 %
Neutro Abs: 6 10*3/uL (ref 1.7–7.7)
Neutrophils Relative %: 61 %
Platelets: 149 10*3/uL — ABNORMAL LOW (ref 150–400)
RBC: 4.34 MIL/uL (ref 4.22–5.81)
RDW: 12.6 % (ref 11.5–15.5)
WBC: 9.8 10*3/uL (ref 4.0–10.5)
nRBC: 0 % (ref 0.0–0.2)

## 2022-03-08 MED ORDER — SODIUM CHLORIDE 0.9 % IV BOLUS: 500.0000 mL | Freq: Once | INTRAVENOUS | Status: DC

## 2022-03-08 MED ORDER — SODIUM CHLORIDE 0.9 % IV BOLUS
1000.0000 mL | Freq: Once | INTRAVENOUS | Status: AC
  Administered 2022-03-08: 1000 mL via INTRAVENOUS

## 2022-03-08 MED ORDER — OXYMETAZOLINE HCL 0.05 % NA SOLN
1.0000 | Freq: Once | NASAL | Status: AC
  Administered 2022-03-08: 1 via NASAL
  Filled 2022-03-08: qty 30

## 2022-03-08 NOTE — ED Notes (Signed)
Patient verbalizes understanding of discharge instructions. Opportunity for questioning and answers were provided. Pt discharged from ED. 

## 2022-03-08 NOTE — ED Notes (Signed)
Patient is being discharged from the Urgent Care and sent to the Emergency Department via POV . Per Marin Roberts PA, patient is in need of higher level of care due to epistaxis. Patient is aware and verbalizes understanding of plan of care.  Vitals:   03/08/22 1226  BP: 131/66  Pulse: (!) 134  Resp: 16  SpO2: 98%

## 2022-03-08 NOTE — ED Provider Notes (Signed)
[place]    CSN: [id number] Arrival date & time: 03/08/22  1212      History   Chief Complaint Chief Complaint  Patient presents with   Epistaxis    HPI Chad Golden is a 61 y.o. male presenting with nosebleed for about 1 hour.  History noncontributory, denies illicit substance use, new medications, trauma to the nose.  He states that he actually developed a short nosebleed 1 day ago, this resolved on its own.  He developed another nosebleed at work today, has attempted Afrin nasal spray, pressing the nostrils together, leaning forward without relief.  He is feeling well otherwise, without chest pain, dizziness, shortness of breath, headaches, vision changes.  HPI  Past Medical History:  Diagnosis Date   Alcohol abuse    Seizures ([place])    None in the last 15 years    Patient Active Problem List   Diagnosis Date Noted   Degenerative disc disease, cervical 12/07/2016    History reviewed. No pertinent surgical history.     Home Medications    Prior to Admission medications   Not on File    Family History Family History  Problem Relation Age of Onset   Healthy Mother    Alcohol abuse Father     Social History Social History   Tobacco Use   Smoking status: Every Day    Packs/day: 0.50    Years: 35.00    Total pack years: 17.50    Types: Cigarettes   Smokeless tobacco: Never  Substance Use Topics   Alcohol use: No    Comment: Sober for 2 years   Drug use: No     Allergies   Milk-related compounds   Review of Systems Review of Systems  HENT:  Positive for nosebleeds.   All other systems reviewed and are negative.    Physical Exam Triage Vital Signs ED Triage Vitals  Enc Vitals Group     BP 03/08/22 1226 131/66     Pulse Rate 03/08/22 1226 (!) 134     Resp 03/08/22 1226 16     Temp --      Temp src --      SpO2 03/08/22 1226 98 %     Weight --      Height --      Head Circumference --      Peak Flow --      Pain  Score 03/08/22 1225 0     Pain Loc --      Pain Edu? --      Excl. in [place]? --    No data found.  Updated Vital Signs BP 131/66 (BP Location: Right Arm)   Pulse (!) 134   Resp 16   SpO2 98%   Visual Acuity Right Eye Distance:   Left Eye Distance:   Bilateral Distance:    Right Eye Near:   Left Eye Near:    Bilateral Near:     Physical Exam Vitals reviewed.  Constitutional:      General: He is not in acute distress.    Appearance: Normal appearance. He is not ill-appearing.  HENT:     Head: Normocephalic and atraumatic.     Nose:     Right Nostril: Epistaxis present.     Left Nostril: Epistaxis present.     Comments: Clots of blood actively emerging from nares.  Pulmonary:     Effort: Pulmonary effort is normal.  Neurological:     General: No  focal deficit present.     Mental Status: He is alert and oriented to person, place, and time.  Psychiatric:        Mood and Affect: Mood normal.        Behavior: Behavior normal.        Thought Content: Thought content normal.        Judgment: Judgment normal.      UC Treatments / Results  Labs (all labs ordered are listed, but only abnormal results are displayed) Labs Reviewed - No data to display  EKG   Radiology No results found.  Procedures Procedures (including critical care time)  Medications Ordered in UC Medications - No data to display  Initial Impression / Assessment and Plan / UC Course  I have reviewed the triage vital signs and the nursing notes.  Pertinent labs & imaging results that were available during my care of the patient were reviewed by me and considered in my medical decision making (see chart for details).     This patient is a very pleasant 61 y.o. year old male presenting with epistaxis x1 hour, not relieved by afrin and pressure. Still actively bleeding with clots. He is normotensive but tachycardic. Denies illicit substance use. Denies associated symptoms including headaches, vision  changes, dizziness.  Denies history of epistaxis in the past.  As he has already failed Afrin and pressure, and is copiously bleeding, sent to the emergency department.  Suspect he will require packing.  He is in agreement.  Final Clinical Impressions(s) / UC Diagnoses   Final diagnoses:  Epistaxis     Discharge Instructions      -Sent to ED via POV   ED Prescriptions   None    PDMP not reviewed this encounter.   Hazel Sams, PA-C 03/08/22 1248

## 2022-03-08 NOTE — Discharge Instructions (Addendum)
Sent to ED via POV ?

## 2022-03-08 NOTE — Discharge Instructions (Addendum)
You were seen in the emergency department today for a nosebleed with a syncopal episode, otherwise known as passing out.  Please follow attached instructions.  If your nose rebleeds please be sure to hold pressure for 10 minutes, if he continues to bleed hold pressure for another 15 minutes, if you have persistent bleeding present to the emergency department.  Please avoid manipulating your nose/blowing it.  Follow-up with ear nose and throat for reassessment.  Additionally follow-up with your primary care provider given your passing out episode for recheck of your blood work as your platelet count was mildly low, your 1 liver function test was mildly elevated, and your bicarb was mildly low, please be sure to stay well-hydrated.  Return to the ER for new or worsening symptoms including but not limited to new or worsening pain, dizziness, lightheadedness, passing out, chest pain, trouble breathing, uncontrolled bleeding, bleeding in other areas, or any other concerns.

## 2022-03-08 NOTE — ED Provider Notes (Signed)
Physical Exam  BP 115/75   Pulse 76   Temp 98 F (36.7 C) (Oral)   Resp 16   SpO2 98%   Physical Exam Vitals and nursing note reviewed.  Constitutional:      General: He is not in acute distress.    Appearance: He is well-developed.  HENT:     Head: Normocephalic and atraumatic.     Right Ear: External ear normal.     Left Ear: External ear normal.     Nose:     Comments: Dried blood within the left nare.  Some hyperemia near Kiesselbach's plexus.  No active bleeding.    Mouth/Throat:     Mouth: Mucous membranes are moist.     Pharynx: Oropharynx is clear.  Eyes:     Extraocular Movements: Extraocular movements intact.     Conjunctiva/sclera: Conjunctivae normal.     Pupils: Pupils are equal, round, and reactive to light.  Cardiovascular:     Rate and Rhythm: Normal rate and regular rhythm.     Heart sounds: No murmur heard. Pulmonary:     Effort: Pulmonary effort is normal. No respiratory distress.     Breath sounds: Normal breath sounds.  Abdominal:     Palpations: Abdomen is soft.     Tenderness: There is no abdominal tenderness.  Musculoskeletal:        General: No swelling.     Cervical back: Neck supple.  Skin:    General: Skin is warm and dry.     Capillary Refill: Capillary refill takes less than 2 seconds.  Neurological:     Mental Status: He is alert.  Psychiatric:        Mood and Affect: Mood normal.     Procedures  Procedures  ED Course / MDM    Medical Decision Making Problems Addressed: Epistaxis: complicated acute illness or injury with systemic symptoms Syncope, unspecified syncope type: complicated acute illness or injury with systemic symptoms  Amount and/or Complexity of Data Reviewed External Data Reviewed: notes. Labs: ordered.  Risk OTC drugs.   Patient is a 61 year old male who presents emergency department for evaluation of epistaxis as well as an episode of diaphoresis and hypotension while being evaluated at an urgent care.   Patient is not currently on blood thinners.  Please see earlier provider notes for additional details regarding this patient's initial hospital presentation.  The patient's lab work did not show any findings concerning for low platelets or anemia.  On reevaluation patient's bleeding from his nose had stopped spontaneously.  He had been given Afrin by both EMS and here at this facility.  On further questioning by the patient it seems that when his nosebleed had initially started and he was told to pinch his nose he was pinching over the bony portion of his nasal bridge and not over the actual soft tissue of his nose.  It is likely that this led to his persistent nosebleed as well as his brief episode of hypotension which resolved and did not recur.  Patient was educated extensively at bedside about the appropriate way to stop a nosebleed should this recur.  Patient was instructed to follow-up with his primary care provider in the outpatient setting in the coming days as needed for symptom recheck.  Plan and findings discussed with patient at bedside who verbalized understanding and was agreeable.  Patient discharged from ED in stable condition.       Nelta Numbers, MD 03/09/22 0149    Nanda Quinton  G, MD 03/13/22 [phone]

## 2022-03-08 NOTE — ED Notes (Signed)
Pt ambulatory in hall with steady gait

## 2022-03-08 NOTE — ED Triage Notes (Signed)
Pt presents with c/o nosebleed x 1 hour

## 2022-03-08 NOTE — ED Triage Notes (Signed)
Pt states nose has been bleeding for about 1 hr and 42mn, [facility] [place] [facility] come to [facility]. Pt arrived and sat in chair and passed out. Pt was diaphoretic and initial bp was 60-40.  Pt moved to bed and is alert and oriented.  Does not remember passing out. Not on blood thinners.

## 2022-03-08 NOTE — ED Provider Notes (Signed)
[place] [facility] EMERGENCY DEPARTMENT Provider Note   CSN: [id number] Arrival date & time: 03/08/22  1248     History  Chief Complaint  Patient presents with   Loss of Consciousness    nose   Epistaxis    Chad Golden is a 61 y.o. male with a history of alcohol abuse who presents to the emergency department from urgent care due to epistaxis that began earlier today.  Patient reports that he began to have bleeding from his left nare, this bled for about 30 minutes and then seemed to resolve with some pressure and Afrin, however it then started bleeding again for an additional 30 minutes prompting urgent care visit.  Given continued bleeding urgent care referred to the emergency department.  Upon emergency department arrival while in triage patient states that he started to feel overheated and lightheaded, per staff he had a syncopal episode with BP 60/40, no head injury or fall to the ground.  Presently patient states he feels better, he states his nose is not bleeding, he currently denies any lightheadedness or dizziness.  He denies any chest pain or shortness of breath today.  He is otherwise been in his usual state of health other than his nosebleeds and syncopal episode earlier.  He did have a brief couple minute nosebleed from the left nare last night as well.  He has not had any additional bleeding problems such as blood in his stool or blood in his urine.  He does drink alcohol daily approximately 3 beers per day.  Denies history of alcohol withdrawal problems.    HPI     Home Medications Prior to Admission medications   Not on File      Allergies    Milk-related compounds    Review of Systems   Review of Systems  Constitutional:  Negative for chills and fever.  HENT:  Positive for nosebleeds. Negative for congestion, ear pain and sore throat.   Respiratory:  Negative for cough and shortness of breath.   Cardiovascular:  Negative for chest pain.   Gastrointestinal:  Negative for abdominal pain, blood in stool and vomiting.  Genitourinary:  Negative for hematuria.  Neurological:  Positive for syncope.  All other systems reviewed and are negative.   Physical Exam Updated Vital Signs BP 115/75   Pulse 76   Temp 98 F (36.7 C) (Oral)   Resp 16   SpO2 98%  Physical Exam Vitals and nursing note reviewed.  Constitutional:      General: He is not in acute distress.    Appearance: He is well-developed. He is not toxic-appearing.  HENT:     Head: Normocephalic and atraumatic.     Nose:     Comments: Dried blood in bilateral nares more so to the left.  No active epistaxis.  No septal hematoma. Eyes:     General:        Right eye: No discharge.        Left eye: No discharge.     Extraocular Movements: Extraocular movements intact.     Conjunctiva/sclera: Conjunctivae normal.     Pupils: Pupils are equal, round, and reactive to light.  Cardiovascular:     Rate and Rhythm: Normal rate and regular rhythm.  Pulmonary:     Effort: No respiratory distress.     Breath sounds: Normal breath sounds. No wheezing or rales.  Abdominal:     General: There is no distension.     Palpations: Abdomen is  soft.     Tenderness: There is no abdominal tenderness.  Musculoskeletal:     Cervical back: Neck supple.  Skin:    General: Skin is warm and dry.  Neurological:     General: No focal deficit present.     Mental Status: He is alert.     Comments: Clear speech.   Psychiatric:        Behavior: Behavior normal.    ED Results / Procedures / Treatments   Labs (all labs ordered are listed, but only abnormal results are displayed) Labs Reviewed  CBC WITH DIFFERENTIAL/PLATELET - Abnormal; Notable for the following components:      Result Value   MCV 105.1 (*)    MCH 36.2 (*)    Platelets 149 (*)    All other components within normal limits  COMPREHENSIVE METABOLIC PANEL    EKG None  Radiology No results  found.  Procedures Procedures    Medications Ordered in ED Medications  oxymetazoline (AFRIN) 0.05 % nasal spray 1 spray (1 spray Each Nare Given 03/08/22 1327)  sodium chloride 0.9 % bolus 1,000 mL (1,000 mLs Intravenous New Bag/Given 03/08/22 1327)    ED Course/ Medical Decision Making/ A&P                           Medical Decision Making Amount and/or Complexity of Data Reviewed Labs: ordered.  Risk OTC drugs.   Patient presents to the ED with complaints of epistaxis with syncopal episode in triage, this involves an extensive number of treatment options, and is a complaint that carries with it a high risk of complications and morbidity. Nontoxic, vitals normal on my assessment, reportedly had a blood pressure of 60/40 in triage with a syncopal event, possibly vagal.  Currently patient's epistaxis is resolved, will continue to monitor, fluids for hydration, will check basic labs, EKG, and cardiac monitor given his syncopal episode..   Additional history obtained:  Chart/nursing notes reviewed.  Reviewed urgent care note.  EKG: Sinus rhythm.  Lab Tests:  I viewed & interpreted labs including:   CBC: Mild thrombocytopenia, no anemia CMP: Bicarb low at 17, normal anion gap, elevated AST.  No critical electrolyte derangement.  ED Course:  Following 1 L of fluids patient states he feels well, he does not feel lightheaded or dizzy, he has not had recurrence of epistaxis, I had the patient get up to ambulate, he did so with fairly steady gait and without symptoms, however his heart rate did increase to 130, will order additional fluids.  15:15: Patient care signed out to Dr. Oneita Hurt @ shift change pending re-assessment and  disposition.   Portions of this note were generated with [employer]. Dictation errors may occur despite best attempts at proofreading.  Final Clinical Impression(s) / ED Diagnoses Final diagnoses:  Epistaxis  Syncope, unspecified syncope type     Rx / DC Orders ED Discharge Orders     None         Amaryllis Dyke, PA-C 03/08/22 1521    Varney Biles, MD 03/11/22 1511

## 2022-12-22 ENCOUNTER — Other Ambulatory Visit: Payer: Self-pay

## 2022-12-22 ENCOUNTER — Emergency Department (HOSPITAL_COMMUNITY): Payer: Self-pay

## 2022-12-22 ENCOUNTER — Observation Stay (HOSPITAL_COMMUNITY)
Admission: EM | Admit: 2022-12-22 | Discharge: 2022-12-23 | Disposition: A | Discharge status: Home / Self Care | Payer: Self-pay | Attending: Internal Medicine | Admitting: Internal Medicine

## 2022-12-22 ENCOUNTER — Observation Stay (HOSPITAL_COMMUNITY): Payer: Self-pay

## 2022-12-22 DIAGNOSIS — R4182 Altered mental status, unspecified: Principal | ICD-10-CM | POA: Diagnosis present

## 2022-12-22 DIAGNOSIS — F17210 Nicotine dependence, cigarettes, uncomplicated: Secondary | ICD-10-CM | POA: Insufficient documentation

## 2022-12-22 DIAGNOSIS — F1010 Alcohol abuse, uncomplicated: Secondary | ICD-10-CM | POA: Insufficient documentation

## 2022-12-22 DIAGNOSIS — R749 Abnormal serum enzyme level, unspecified: Secondary | ICD-10-CM | POA: Insufficient documentation

## 2022-12-22 DIAGNOSIS — R404 Transient alteration of awareness: Secondary | ICD-10-CM

## 2022-12-22 DIAGNOSIS — D696 Thrombocytopenia, unspecified: Secondary | ICD-10-CM | POA: Insufficient documentation

## 2022-12-22 DIAGNOSIS — R569 Unspecified convulsions: Secondary | ICD-10-CM

## 2022-12-22 DIAGNOSIS — E871 Hypo-osmolality and hyponatremia: Secondary | ICD-10-CM | POA: Insufficient documentation

## 2022-12-22 DIAGNOSIS — D7589 Other specified diseases of blood and blood-forming organs: Secondary | ICD-10-CM | POA: Insufficient documentation

## 2022-12-22 LAB — OSMOLALITY: Osmolality: 277 mOsm/kg (ref 275–295)

## 2022-12-22 LAB — CBC WITH DIFFERENTIAL/PLATELET
Abs Immature Granulocytes: 0.02 10*3/uL (ref 0.00–0.07)
Basophils Absolute: 0.1 10*3/uL (ref 0.0–0.1)
Basophils Relative: 1 %
Eosinophils Absolute: 0 10*3/uL (ref 0.0–0.5)
Eosinophils Relative: 0 %
HCT: 43.5 % (ref 39.0–52.0)
Hemoglobin: 15.4 g/dL (ref 13.0–17.0)
Immature Granulocytes: 0 %
Lymphocytes Relative: 22 %
Lymphs Abs: 1.1 10*3/uL (ref 0.7–4.0)
MCH: 35.6 pg — ABNORMAL HIGH (ref 26.0–34.0)
MCHC: 35.4 g/dL (ref 30.0–36.0)
MCV: 100.5 fL — ABNORMAL HIGH (ref 80.0–100.0)
Monocytes Absolute: 0.4 10*3/uL (ref 0.1–1.0)
Monocytes Relative: 7 %
Neutro Abs: 3.4 10*3/uL (ref 1.7–7.7)
Neutrophils Relative %: 70 %
Platelets: 61 10*3/uL — ABNORMAL LOW (ref 150–400)
RBC: 4.33 MIL/uL (ref 4.22–5.81)
RDW: 15.7 % — ABNORMAL HIGH (ref 11.5–15.5)
WBC: 4.9 10*3/uL (ref 4.0–10.5)
nRBC: 0 % (ref 0.0–0.2)

## 2022-12-22 LAB — COMPREHENSIVE METABOLIC PANEL
ALT: 87 U/L — ABNORMAL HIGH (ref 0–44)
AST: 195 U/L — ABNORMAL HIGH (ref 15–41)
Albumin: 3.8 g/dL (ref 3.5–5.0)
Alkaline Phosphatase: 84 U/L (ref 38–126)
Anion gap: 24 — ABNORMAL HIGH (ref 5–15)
BUN: 8 mg/dL (ref 8–23)
CO2: 11 mmol/L — ABNORMAL LOW (ref 22–32)
Calcium: 8.8 mg/dL — ABNORMAL LOW (ref 8.9–10.3)
Chloride: 96 mmol/L — ABNORMAL LOW (ref 98–111)
Creatinine, Ser: 1.01 mg/dL (ref 0.61–1.24)
GFR, Estimated: >60 mL/min (ref >60)
Glucose, Bld: 100 mg/dL — ABNORMAL HIGH (ref 70–99)
Potassium: 3.7 mmol/L (ref 3.5–5.1)
Sodium: 131 mmol/L — ABNORMAL LOW (ref 135–145)
Total Bilirubin: 1.9 mg/dL — ABNORMAL HIGH (ref 0.3–1.2)
Total Protein: 7 g/dL (ref 6.5–8.1)

## 2022-12-22 LAB — I-STAT VENOUS BLOOD GAS, ED
Acid-base deficit: 12 mmol/L — ABNORMAL HIGH (ref 0.0–2.0)
Bicarbonate: 11.2 mmol/L — ABNORMAL LOW (ref 20.0–28.0)
Calcium, Ion: 1.04 mmol/L — ABNORMAL LOW (ref 1.15–1.40)
HCT: 50 % (ref 39.0–52.0)
Hemoglobin: 17 g/dL (ref 13.0–17.0)
O2 Saturation: 99 %
Potassium: 3.7 mmol/L (ref 3.5–5.1)
Sodium: 130 mmol/L — ABNORMAL LOW (ref 135–145)
TCO2: 12 mmol/L — ABNORMAL LOW (ref 22–32)
pCO2, Ven: 21.8 mmHg — ABNORMAL LOW (ref 44–60)
pH, Ven: 7.318 (ref 7.25–7.43)
pO2, Ven: 122 mmHg — ABNORMAL HIGH (ref 32–45)

## 2022-12-22 LAB — RAPID URINE DRUG SCREEN, HOSP PERFORMED
Amphetamines: NOT DETECTED
Barbiturates: NOT DETECTED
Benzodiazepines: NOT DETECTED
Cocaine: NOT DETECTED
Opiates: NOT DETECTED
Tetrahydrocannabinol: NOT DETECTED

## 2022-12-22 LAB — URINALYSIS, ROUTINE W REFLEX MICROSCOPIC
Bacteria, UA: NONE SEEN
Bilirubin Urine: NEGATIVE
Glucose, UA: NEGATIVE mg/dL
Ketones, ur: 20 mg/dL — AB
Leukocytes,Ua: NEGATIVE
Nitrite: NEGATIVE
Protein, ur: 100 mg/dL — AB
Specific Gravity, Urine: 1.016 (ref 1.005–1.030)
pH: 5 (ref 5.0–8.0)

## 2022-12-22 LAB — LACTIC ACID, PLASMA: Lactic Acid, Venous: 4.8 mmol/L (ref 0.5–1.9)

## 2022-12-22 LAB — ETHANOL: Alcohol, Ethyl (B): <10 mg/dL (ref <10)

## 2022-12-22 LAB — CBG MONITORING, ED: Glucose-Capillary: 90 mg/dL (ref 70–99)

## 2022-12-22 MED ORDER — HYDRALAZINE HCL 20 MG/ML IJ SOLN: 5.0000 mg | Freq: Four times a day (QID) | INTRAMUSCULAR | Status: DC | PRN

## 2022-12-22 MED ORDER — ONDANSETRON HCL 4 MG PO TABS: 4.0000 mg | ORAL_TABLET | Freq: Four times a day (QID) | ORAL | Status: DC | PRN

## 2022-12-22 MED ORDER — NICOTINE 21 MG/24HR TD PT24
21.0000 mg | MEDICATED_PATCH | Freq: Every day | TRANSDERMAL | Status: DC
  Administered 2022-12-23: 21 mg via TRANSDERMAL
  Filled 2022-12-22: qty 1

## 2022-12-22 MED ORDER — POLYETHYLENE GLYCOL 3350 17 G PO PACK: 17.0000 g | PACK | Freq: Every day | ORAL | Status: DC | PRN

## 2022-12-22 MED ORDER — ADULT MULTIVITAMIN W/MINERALS CH
1.0000 | ORAL_TABLET | Freq: Every day | ORAL | Status: DC
  Administered 2022-12-22 – 2022-12-23 (×2): 1 via ORAL
  Filled 2022-12-22 (×2): qty 1

## 2022-12-22 MED ORDER — FOLIC ACID 1 MG PO TABS
1.0000 mg | ORAL_TABLET | Freq: Every day | ORAL | Status: DC
  Administered 2022-12-22 – 2022-12-23 (×2): 1 mg via ORAL
  Filled 2022-12-22 (×2): qty 1

## 2022-12-22 MED ORDER — ONDANSETRON HCL 4 MG/2ML IJ SOLN: 4.0000 mg | Freq: Four times a day (QID) | INTRAMUSCULAR | Status: DC | PRN

## 2022-12-22 MED ORDER — LORAZEPAM 2 MG/ML IJ SOLN: 1.0000 mg | INTRAMUSCULAR | Status: DC | PRN

## 2022-12-22 MED ORDER — POLYVINYL ALCOHOL 1.4 % OP SOLN: 1.0000 [drp] | OPHTHALMIC | Status: DC | PRN

## 2022-12-22 MED ORDER — SODIUM CHLORIDE 0.9 % IV SOLN: INTRAVENOUS | Status: DC

## 2022-12-22 MED ORDER — LACTATED RINGERS IV BOLUS
1000.0000 mL | Freq: Once | INTRAVENOUS | Status: AC
  Administered 2022-12-22: 1000 mL via INTRAVENOUS

## 2022-12-22 MED ORDER — THIAMINE MONONITRATE 100 MG PO TABS
100.0000 mg | ORAL_TABLET | Freq: Every day | ORAL | Status: DC
  Administered 2022-12-22 – 2022-12-23 (×2): 100 mg via ORAL
  Filled 2022-12-22 (×2): qty 1

## 2022-12-22 MED ORDER — LORAZEPAM 1 MG PO TABS: 1.0000 mg | ORAL_TABLET | ORAL | Status: DC | PRN

## 2022-12-22 MED ORDER — THIAMINE HCL 100 MG/ML IJ SOLN: 100.0000 mg | Freq: Every day | INTRAMUSCULAR | Status: DC

## 2022-12-22 NOTE — ED Provider Notes (Signed)
[place] EMERGENCY DEPARTMENT AT [facility] Provider Note   CSN: [id number] Arrival date & time: 12/22/22  1252     History  Chief Complaint  Patient presents with   Altered Mental Status    Chad Golden is a 62 y.o. male.  Patient is a 61 year old male with a past medical history of alcohol use and remote history of seizures presenting to the emergency department with altered mental status.  Per EMS family called 911 after having found him outside in the grass.  On EMS arrival the patient was altered with a GCS of 12 and improved to 15 on his arrival to the emergency department.  He was noted to have urinary incontinence.  The patient does not remember what happened this morning or how he got outside.  He states that he does drink alcohol and last drink yesterday but is unable to specify how much he has drank.  He states that he has not had alcohol withdrawal and does not think that his seizures were alcohol related in the past.  He states he is not on any antiepileptics.  He denies any recent nausea, vomiting or diarrhea.  He is unsure if he fell or injured himself but denies any pain at this time.  The history is provided by the patient and the EMS personnel.  Altered Mental Status      Home Medications Prior to Admission medications   Not on File      Allergies    Milk-related compounds    Review of Systems   Review of Systems  Physical Exam Updated Vital Signs BP 127/88   Pulse (!) 101   Temp 98.5 F (36.9 C) (Oral)   Resp (!) 22   Ht 5\' 6"  (1.676 m)   Wt 61.2 kg   SpO2 96%   BMI 21.79 kg/m  Physical Exam Vitals and nursing note reviewed.  Constitutional:      General: He is not in acute distress.    Appearance: Normal appearance.  HENT:     Head: Normocephalic.     Comments: Hematoma to the left side of forehead    Nose: Nose normal.     Mouth/Throat:     Mouth: Mucous membranes are moist.     Pharynx: Oropharynx is clear.      Comments: No tongue bite Eyes:     Extraocular Movements: Extraocular movements intact.     Conjunctiva/sclera: Conjunctivae normal.     Pupils: Pupils are equal, round, and reactive to light.  Neck:     Comments: No midline neck tenderness, c-collar in place Cardiovascular:     Rate and Rhythm: Regular rhythm. Tachycardia present.     Pulses: Normal pulses.     Heart sounds: Normal heart sounds.  Pulmonary:     Effort: Pulmonary effort is normal.     Breath sounds: Normal breath sounds.  Abdominal:     General: Abdomen is flat.     Palpations: Abdomen is soft.     Tenderness: There is no abdominal tenderness.  Musculoskeletal:     Comments: No midline back tenderness No bony tenderness to bilateral upper or lower extremities  Skin:    General: Skin is warm and dry.  Neurological:     General: No focal deficit present.     Mental Status: He is alert and oriented to person, place, and time.     Cranial Nerves: No cranial nerve deficit.     Sensory: No sensory deficit.  Motor: No weakness.  Psychiatric:        Mood and Affect: Mood normal.        Behavior: Behavior normal.     ED Results / Procedures / Treatments   Labs (all labs ordered are listed, but only abnormal results are displayed) Labs Reviewed  COMPREHENSIVE METABOLIC PANEL - Abnormal; Notable for the following components:      Result Value   Sodium 131 (*)    Chloride 96 (*)    CO2 11 (*)    Glucose, Bld 100 (*)    Calcium 8.8 (*)    AST 195 (*)    ALT 87 (*)    Total Bilirubin 1.9 (*)    Anion gap 24 (*)    All other components within normal limits  CBC WITH DIFFERENTIAL/PLATELET - Abnormal; Notable for the following components:   MCV 100.5 (*)    MCH 35.6 (*)    RDW 15.7 (*)    Platelets 61 (*)    All other components within normal limits  I-STAT VENOUS BLOOD GAS, ED - Abnormal; Notable for the following components:   pCO2, Ven 21.8 (*)    pO2, Ven 122 (*)    Bicarbonate 11.2 (*)    TCO2 12  (*)    Acid-base deficit 12.0 (*)    Sodium 130 (*)    Calcium, Ion 1.04 (*)    All other components within normal limits  ETHANOL  URINALYSIS, ROUTINE W REFLEX MICROSCOPIC  RAPID URINE DRUG SCREEN, HOSP PERFORMED  LACTIC ACID, PLASMA  LACTIC ACID, PLASMA  OSMOLALITY  VOLATILES,BLD-ACETONE,ETHANOL,ISOPROP,METHANOL  CBG MONITORING, ED    EKG None  Radiology CT Head Wo Contrast  Result Date: 12/22/2022 CLINICAL DATA:  [name].  Head trauma. EXAM: CT HEAD WITHOUT CONTRAST CT CERVICAL SPINE WITHOUT CONTRAST TECHNIQUE: Multidetector CT imaging of the head and cervical spine was performed following the standard protocol without intravenous contrast. Multiplanar CT image reconstructions of the cervical spine were also generated. RADIATION DOSE REDUCTION: This exam was performed according to the departmental dose-optimization program which includes automated exposure control, adjustment of the mA and/or kV according to patient size and/or use of iterative reconstruction technique. COMPARISON:  Head CT 09/27/2014 FINDINGS: CT HEAD FINDINGS Brain: Mild age advanced cerebral atrophy, ventriculomegaly and periventricular white matter disease. No acute intracranial findings such as hemispheric infarction or intracranial hemorrhage. No mass lesions. Brainstem and cerebellum are grossly normal. There are mild changes of cerebellar atrophy. Vascular: Vascular calcifications but no aneurysm or hyperdense vessels. Skull: No skull fracture or bone lesions. Sinuses/Orbits: No acute paranasal sinus disease. Small mucous retention cyst or polyp noted in the right maxillary sinus. 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 The globes are intact. Other: Enlarging fatty lesion in the left frontal scalp area likely benign scalp lipoma. CT CERVICAL SPINE FINDINGS Alignment: Normal overall alignment. Skull base and vertebrae: No acute fracture. No primary bone lesion or focal  pathologic process. Soft tissues and spinal canal: No prevertebral fluid or swelling. No visible canal hematoma. Disc levels: Advanced degenerative cervical spondylosis with advanced degenerative disc disease at C4-5, C5-6 and C6-7. No significant canal stenosis. Mild bony foraminal stenosis at these levels due to uncinate spurring and facet disease. Upper chest: The lung apices demonstrate emphysematous changes. Other: Bilateral carotid artery calcifications. IMPRESSION: 1. Age advanced cerebral atrophy, ventriculomegaly and periventricular white matter disease. 2. No acute intracranial findings or skull fracture. 3. Advanced degenerative cervical spondylosis but  no acute cervical spine fracture. Electronically Signed   By: Rudie Meyer M.D.   On: 12/22/2022 14:37   CT Cervical Spine Wo Contrast  Result Date: 12/22/2022 CLINICAL DATA:  [name].  Head trauma. EXAM: CT HEAD WITHOUT CONTRAST CT CERVICAL SPINE WITHOUT CONTRAST TECHNIQUE: Multidetector CT imaging of the head and cervical spine was performed following the standard protocol without intravenous contrast. Multiplanar CT image reconstructions of the cervical spine were also generated. RADIATION DOSE REDUCTION: This exam was performed according to the departmental dose-optimization program which includes automated exposure control, adjustment of the mA and/or kV according to patient size and/or use of iterative reconstruction technique. COMPARISON:  Head CT 09/27/2014 FINDINGS: CT HEAD FINDINGS Brain: Mild age advanced cerebral atrophy, ventriculomegaly and periventricular white matter disease. No acute intracranial findings such as hemispheric infarction or intracranial hemorrhage. No mass lesions. Brainstem and cerebellum are grossly normal. There are mild changes of cerebellar atrophy. Vascular: Vascular calcifications but no aneurysm or hyperdense vessels. Skull: No skull fracture or bone lesions. Sinuses/Orbits: No acute paranasal sinus disease. Small  mucous retention cyst or polyp noted in the right maxillary sinus. 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 The globes are intact. Other: Enlarging fatty lesion in the left frontal scalp area likely benign scalp lipoma. CT CERVICAL SPINE FINDINGS Alignment: Normal overall alignment. Skull base and vertebrae: No acute fracture. No primary bone lesion or focal pathologic process. Soft tissues and spinal canal: No prevertebral fluid or swelling. No visible canal hematoma. Disc levels: Advanced degenerative cervical spondylosis with advanced degenerative disc disease at C4-5, C5-6 and C6-7. No significant canal stenosis. Mild bony foraminal stenosis at these levels due to uncinate spurring and facet disease. Upper chest: The lung apices demonstrate emphysematous changes. Other: Bilateral carotid artery calcifications. IMPRESSION: 1. Age advanced cerebral atrophy, ventriculomegaly and periventricular white matter disease. 2. No acute intracranial findings or skull fracture. 3. Advanced degenerative cervical spondylosis but no acute cervical spine fracture. Electronically Signed   By: Rudie Meyer M.D.   On: 12/22/2022 14:37    Procedures Procedures    Medications Ordered in ED Medications  LORazepam (ATIVAN) tablet 1-4 mg (has no administration in time range)    Or  LORazepam (ATIVAN) injection 1-4 mg (has no administration in time range)  thiamine (VITAMIN B1) tablet 100 mg (100 mg Oral Given 12/22/22 1521)    Or  thiamine (VITAMIN B1) injection 100 mg ( Intravenous See Alternative 12/22/22 1521)  folic acid (FOLVITE) tablet 1 mg (1 mg Oral Given 12/22/22 1521)  multivitamin with minerals tablet 1 tablet (1 tablet Oral Given 12/22/22 1521)  lactated ringers bolus 1,000 mL (1,000 mLs Intravenous Bolus 12/22/22 1318)  lactated ringers bolus 1,000 mL (1,000 mLs Intravenous New Bag/Given 12/22/22 1521)    ED Course/ Medical Decision Making/ A&P Clinical  Course as of 12/22/22 1536  Sat Dec 22, 2022  1534 CT imaging negative, c-collar cleared by myself. Labs with metabolic acidosis of unknown etiology. He will have lactic acid, osm and toxic alcohols sent. He denies having drank any other known toxic alcohols. Due to metabolic acidosis and possible seizure patient will be admitted for further evaluation. [VK]    Clinical Course User Index [VK] Rexford Maus, DO                             Medical Decision Making This patient presents to the ED with chief  complaint(s) of altered mental status with pertinent past medical history of alcohol use, prior seizures which further complicates the presenting complaint. The complaint involves an extensive differential diagnosis and also carries with it a high risk of complications and morbidity.    The differential diagnosis includes seizure, intoxication, alcohol withdrawal, electrolyte abnormality, ICH, mass effect, cervical spine injury, infection, ACS, arrhythmia, syncope  Additional history obtained: Additional history obtained from EMS  Records reviewed Primary Care Documents  ED Course and Reassessment: On patient's arrival to the emergency department he is mild confusion with repetitive questioning but is oriented and without other focal neurologic deficits.  He does have a forehead hematoma and will have a CT head and C-spine to evaluate for acute traumatic injury.  The patient did have urinary incontinence concerning for possible seizure and postictal period causing his altered mental status.  He will have EKG, labs and urine performed to additionally evaluate for cause of his syncope versus seizure.  He will be placed on CIWA protocol.  He does not currently appear to be acutely withdrawing from alcohol.  Independent labs interpretation:  The following labs were independently interpreted: metabolic acidosis  Independent visualization of imaging: - I independently visualized the  following imaging with scope of interpretation limited to determining acute life threatening conditions related to emergency care: CTH/C-spine which revealed no acute traumatic injury  Consultation: - Consulted or discussed management/test interpretation w/ external professional: hospitalist  Consideration for admission or further workup: patient requires admission for his AMS in the setting of metabolic acidosis Social Determinants of health: ETOH use disorder    Amount and/or Complexity of Data Reviewed Labs: ordered. Radiology: ordered.  Risk OTC drugs. Prescription drug management. Decision regarding hospitalization.          Final Clinical Impression(s) / ED Diagnoses Final diagnoses:  Altered mental status, unspecified altered mental status type    Rx / DC Orders ED Discharge Orders     None         Rexford Maus, DO 12/22/22 1536

## 2022-12-22 NOTE — Hospital Course (Signed)
Patient is a 62 year old male with past medical history of alcohol abuse and history of seizures presented to hospital with altered mental status.  Patient was found to be outside in the grass and EMS was called and.  He was also noted to have urinary incontinence but patient did not remember what happened to him.  Last drink was yesterday.  Patient is not on any antilipid epileptics.  In the ED was somnolent.  Bicarb was low at 11.  Anion gap of 24.  CT head was negative for acute findings.  Alcohol level was negative.  Patient was then considered for admission to the hospital for further evaluation and treatment.

## 2022-12-22 NOTE — ED Notes (Signed)
Chad Golden  [phone]. Contact when pt is moved and has a room.

## 2022-12-22 NOTE — Progress Notes (Signed)
EEG complete - results pending 

## 2022-12-22 NOTE — Procedures (Signed)
History: 62 yo M with a history of seizures presenting with AMS  Sedation: none  Technique: This EEG was acquired with electrodes placed according to the International 10-20 electrode system (including Fp1, Fp2, F3, F4, C3, C4, P3, P4, O1, O2, T3, T4, T5, T6, A1, A2, Fz, Cz, Pz). The following electrodes were missing or displaced: none.   Background: The background consists of intermixed alpha and beta activities. There is a well defined posterior dominant rhythm of 9-10 Hz that attenuates with eye opening. Sleep is not recorded .   Photic stimulation: Physiologic driving is not performed  EEG Abnormalities: none  Clinical Interpretation: This normal EEG is recorded in the waking state. There was no seizure or seizure predisposition recorded on this study. Please note that lack of epileptiform activity on EEG does not preclude the possibility of epilepsy.   Ritta Slot, MD [facility] [phone]  If 7pm- 7am, please page neurology on call as listed in [facility]ION.

## 2022-12-22 NOTE — ED Notes (Signed)
Patient transported to CT 

## 2022-12-22 NOTE — H&P (Addendum)
[facility] History and Physical  Chad Golden BHA:[id number] DOB: 1961/08/07 DOA: 12/22/2022  Referring physician: ED  PCP: Patient, No Pcp Per   Patient is coming from: Home  Chief Complaint: Altered mental status  HPI:  Patient is a 62 year old male with past medical history of alcohol abuse and history of seizures presented to hospital with altered mental status.  Patient was found to be outside in the grass and EMS was called in.  Patient's sister reported that patient did have some froth in his mouth as reported by bystanders.  Patient does drink daily and has not had a drink in the last 2 days.  He was trying to quit as per the patient.  Patient was also noted to have urinary incontinence but patient did not remember what happened to him.    Patient has history of seizures in the past thought to be secondary to alcohol withdrawal and is not on antiepileptics.  In the ED, patient complains of pain over the hands and redness of his eyes.  Redness of his eyes has been going on for a while but without any pain.  Patient was initially was somnolent.  Bicarb was low at 11.  Anion gap of 24.  CT head was positive for cerebral atrophy and ventriculomegaly and degenerative changes in the cervical spine.  CT cervical spine with degenerative changes.  Alcohol level was less than 10.  Patient received 2 L of IV fluid bolus in the ED, was started on CIWA protocol and was admitted hospital for further evaluation and treatment.  Assessment and Plan  Principal Problem:   Altered mental status Active Problems:   Alcohol abuse   Seizures   Altered mental status.  Improving.  Disoriented to time and month.  More communicative at the time of my exam.  Uncertain etiology.  Possible postictal from withdrawal seizures.  Question use of other alcohols.  Alcohol level was less than 10 but bicarb was low.  No anion gap.  Will check lactate.  Continue IV hydration.  Continue supportive care.  Will put  the patient on CIWA protocol due to history of alcohol abuse.  Check serum osmolality, volatile  levels.  Check urine drug screen.  VBG showed normal pH but low bicarb.  Continuing telemetry.  24-hour observation.  If respiratory status might need neurology evaluation.  History of seizures.  Found outside in the grass.  Likely alcohol withdrawal seizures.  Had incontinence for 3 months..  Will closely monitor for seizures.  Seizure precautions, Fall precautions.  Add EEG.  History of alcohol use disorder with elevated LFTs.  Continue thiamine, folic acid and multivitamins.  Continue CIWA protocol.  TOC consult for substance abuse.  Thrombocytopenia likely secondary to chronic liver disease and alcohol use.  Macrocytosis likely secondary to chronic alcohol use.  Check TSH and vitamin B12.  Hemoglobin on presentation at 15.4.  Will repeat in AM.  Mild hyponatremia.  Secondary to alcohol usage.  Check BMP in AM.  History of cigarette smoking.  Will put the patient on nicotine patch.  Counseling done.  DVT Prophylaxis: SCD.  Review of Systems:  All systems were reviewed and were negative unless otherwise mentioned in the HPI   Past Medical History:  Diagnosis Date   Alcohol abuse    Seizures (HCC)    None in the last 15 years   No past surgical history on file.  Social History:  reports that he has been smoking cigarettes. He has a 17.50 pack-year  smoking history. He has never used smokeless tobacco. He reports current alcohol use. He reports that he does not use drugs.  Allergies  Allergen Reactions   Milk-Related Compounds Nausea And Vomiting    Family History  Problem Relation Age of Onset   Healthy Mother    Alcohol abuse Father      Prior to Admission medications   Not on File    Physical Exam: Vitals:   12/22/22 1302 12/22/22 1305 12/22/22 1311 12/22/22 1315  BP: 136/89   127/88  Pulse: (!) 109  (!) 105 (!) 101  Resp:  19 17 (!) 22  Temp:      TempSrc:       SpO2: 98%  96% 96%  Weight:      Height:       Wt Readings from Last 3 Encounters:  12/22/22 61.2 kg  02/26/17 61.2 kg  01/29/17 61.2 kg   Body mass index is 21.79 kg/m.  General:  Average built, not in obvious distress HENT: Normocephalic, No scleral pallor or icterus noted. Oral mucosa is moist.  Chest:  Clear breath sounds.  . No crackles or wheezes.  CVS: S1 &S2 heard. No murmur.  Regular rate and rhythm. Abdomen: Soft, nontender, nondistended.  Bowel sounds are heard. No abdominal mass palpated Extremities: No cyanosis, clubbing or edema.  Peripheral pulses are palpable. Psych: Alert, awake and Communicative oriented to place but disoriented to time. CNS:  No cranial nerve deficits.  Power equal in all extremities.   Skin: Warm and dry.  No rashes noted.  Labs on Admission:   CBC: Recent Labs  Lab 12/22/22 1328 12/22/22 1425  WBC 4.9  --   NEUTROABS 3.4  --   HGB 15.4 17.0  HCT 43.5 50.0  MCV 100.5*  --   PLT 61*  --     Basic Metabolic Panel: Recent Labs  Lab 12/22/22 1328 12/22/22 1425  NA 131* 130*  K 3.7 3.7  CL 96*  --   CO2 11*  --   GLUCOSE 100*  --   BUN 8  --   CREATININE 1.01  --   CALCIUM 8.8*  --     Liver Function Tests: Recent Labs  Lab 12/22/22 1328  AST 195*  ALT 87*  ALKPHOS 84  BILITOT 1.9*  PROT 7.0  ALBUMIN 3.8   No results for input(s): "LIPASE", "AMYLASE" in the last 168 hours. No results for input(s): "AMMONIA" in the last 168 hours.  Cardiac Enzymes: No results for input(s): "CKTOTAL", "CKMB", "CKMBINDEX", "TROPONINI" in the last 168 hours.  BNP (last 3 results) No results for input(s): "BNP" in the last 8760 hours.  ProBNP (last 3 results) No results for input(s): "PROBNP" in the last 8760 hours.  CBG: Recent Labs  Lab 12/22/22 1529  GLUCAP 90    Lipase  No results found for: "LIPASE"    Radiological Exams on Admission: CT Head Wo Contrast  Result Date: 12/22/2022 CLINICAL DATA:  [name].  Head  trauma. EXAM: CT HEAD WITHOUT CONTRAST CT CERVICAL SPINE WITHOUT CONTRAST TECHNIQUE: Multidetector CT imaging of the head and cervical spine was performed following the standard protocol without intravenous contrast. Multiplanar CT image reconstructions of the cervical spine were also generated. RADIATION DOSE REDUCTION: This exam was performed according to the departmental dose-optimization program which includes automated exposure control, adjustment of the mA and/or kV according to patient size and/or use of iterative reconstruction technique. COMPARISON:  Head CT 09/27/2014 FINDINGS: CT HEAD FINDINGS Brain:  Mild age advanced cerebral atrophy, ventriculomegaly and periventricular white matter disease. No acute intracranial findings such as hemispheric infarction or intracranial hemorrhage. No mass lesions. Brainstem and cerebellum are grossly normal. There are mild changes of cerebellar atrophy. Vascular: Vascular calcifications but no aneurysm or hyperdense vessels. Skull: No skull fracture or bone lesions. Sinuses/Orbits: No acute paranasal sinus disease. Small mucous retention cyst or polyp noted in the right maxillary sinus. 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 The globes are intact. Other: Enlarging fatty lesion in the left frontal scalp area likely benign scalp lipoma. CT CERVICAL SPINE FINDINGS Alignment: Normal overall alignment. Skull base and vertebrae: No acute fracture. No primary bone lesion or focal pathologic process. Soft tissues and spinal canal: No prevertebral fluid or swelling. No visible canal hematoma. Disc levels: Advanced degenerative cervical spondylosis with advanced degenerative disc disease at C4-5, C5-6 and C6-7. No significant canal stenosis. Mild bony foraminal stenosis at these levels due to uncinate spurring and facet disease. Upper chest: The lung apices demonstrate emphysematous changes. Other: Bilateral carotid artery  calcifications. IMPRESSION: 1. Age advanced cerebral atrophy, ventriculomegaly and periventricular white matter disease. 2. No acute intracranial findings or skull fracture. 3. Advanced degenerative cervical spondylosis but no acute cervical spine fracture. Electronically Signed   By: Rudie Meyer M.D.   On: 12/22/2022 14:37   CT Cervical Spine Wo Contrast  Result Date: 12/22/2022 CLINICAL DATA:  [name].  Head trauma. EXAM: CT HEAD WITHOUT CONTRAST CT CERVICAL SPINE WITHOUT CONTRAST TECHNIQUE: Multidetector CT imaging of the head and cervical spine was performed following the standard protocol without intravenous contrast. Multiplanar CT image reconstructions of the cervical spine were also generated. RADIATION DOSE REDUCTION: This exam was performed according to the departmental dose-optimization program which includes automated exposure control, adjustment of the mA and/or kV according to patient size and/or use of iterative reconstruction technique. COMPARISON:  Head CT 09/27/2014 FINDINGS: CT HEAD FINDINGS Brain: Mild age advanced cerebral atrophy, ventriculomegaly and periventricular white matter disease. No acute intracranial findings such as hemispheric infarction or intracranial hemorrhage. No mass lesions. Brainstem and cerebellum are grossly normal. There are mild changes of cerebellar atrophy. Vascular: Vascular calcifications but no aneurysm or hyperdense vessels. Skull: No skull fracture or bone lesions. Sinuses/Orbits: No acute paranasal sinus disease. Small mucous retention cyst or polyp noted in the right maxillary sinus. 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 The globes are intact. Other: Enlarging fatty lesion in the left frontal scalp area likely benign scalp lipoma. CT CERVICAL SPINE FINDINGS Alignment: Normal overall alignment. Skull base and vertebrae: No acute fracture. No primary bone lesion or focal pathologic process. Soft tissues and  spinal canal: No prevertebral fluid or swelling. No visible canal hematoma. Disc levels: Advanced degenerative cervical spondylosis with advanced degenerative disc disease at C4-5, C5-6 and C6-7. No significant canal stenosis. Mild bony foraminal stenosis at these levels due to uncinate spurring and facet disease. Upper chest: The lung apices demonstrate emphysematous changes. Other: Bilateral carotid artery calcifications. IMPRESSION: 1. Age advanced cerebral atrophy, ventriculomegaly and periventricular white matter disease. 2. No acute intracranial findings or skull fracture. 3. Advanced degenerative cervical spondylosis but no acute cervical spine fracture. Electronically Signed   By: Rudie Meyer M.D.   On: 12/22/2022 14:37    EKG: Not available for review   Consultant: None  Code Status: Full code  Microbiology none  Antibiotics: None  Family Communication:  Patients'  condition and plan of care including tests being ordered have been discussed with the patient and sister and mother at bedside who indicate understanding and agree with the plan.   Status is: Observation The patient remains OBS appropriate and will d/c before 2 midnights.   Severity of Illness: The appropriate patient status for this patient is OBSERVATION. Observation status is judged to be reasonable and necessary in order to provide the required intensity of service to ensure the patient's safety. The patient's presenting symptoms, physical exam findings, and initial radiographic and laboratory data in the context of their medical condition is felt to place them at decreased risk for further clinical deterioration. Furthermore, it is anticipated that the patient will be medically stable for discharge from the hospital within 2 midnights of admission.   Signed, Joycelyn Das, MD [facility] 12/22/2022

## 2022-12-22 NOTE — ED Notes (Signed)
Warmed blanket provided.

## 2022-12-22 NOTE — ED Triage Notes (Signed)
Found by family laying in the yard very altered incontinent.  Per ems GCS 12 on arrival currently 14 has had incontinence of urine wants to go home at this time.   Per family report he has been clean for a few months but reports substance abuse in the passed

## 2022-12-23 DIAGNOSIS — R569 Unspecified convulsions: Secondary | ICD-10-CM

## 2022-12-23 DIAGNOSIS — F1010 Alcohol abuse, uncomplicated: Secondary | ICD-10-CM

## 2022-12-23 LAB — LIPID PANEL
Cholesterol: 207 mg/dL — ABNORMAL HIGH (ref 0–200)
HDL: 71 mg/dL (ref >40)
LDL Cholesterol: 116 mg/dL — ABNORMAL HIGH (ref 0–99)
Total CHOL/HDL Ratio: 2.9 RATIO
Triglycerides: 99 mg/dL (ref <150)
VLDL: 20 mg/dL (ref 0–40)

## 2022-12-23 LAB — CBC
HCT: 37.4 % — ABNORMAL LOW (ref 39.0–52.0)
Hemoglobin: 13.6 g/dL (ref 13.0–17.0)
MCH: 36 pg — ABNORMAL HIGH (ref 26.0–34.0)
MCHC: 36.4 g/dL — ABNORMAL HIGH (ref 30.0–36.0)
MCV: 98.9 fL (ref 80.0–100.0)
Platelets: 54 10*3/uL — ABNORMAL LOW (ref 150–400)
RBC: 3.78 MIL/uL — ABNORMAL LOW (ref 4.22–5.81)
RDW: 15.3 % (ref 11.5–15.5)
WBC: 5 10*3/uL (ref 4.0–10.5)
nRBC: 0 % (ref 0.0–0.2)

## 2022-12-23 LAB — MAGNESIUM: Magnesium: 1.7 mg/dL (ref 1.7–2.4)

## 2022-12-23 LAB — PROTIME-INR
INR: 1.1 (ref 0.8–1.2)
Prothrombin Time: 14.1 seconds (ref 11.4–15.2)

## 2022-12-23 LAB — COMPREHENSIVE METABOLIC PANEL
ALT: 62 U/L — ABNORMAL HIGH (ref 0–44)
AST: 96 U/L — ABNORMAL HIGH (ref 15–41)
Albumin: 3 g/dL — ABNORMAL LOW (ref 3.5–5.0)
Alkaline Phosphatase: 60 U/L (ref 38–126)
Anion gap: 16 — ABNORMAL HIGH (ref 5–15)
BUN: 7 mg/dL — ABNORMAL LOW (ref 8–23)
CO2: 17 mmol/L — ABNORMAL LOW (ref 22–32)
Calcium: 8.2 mg/dL — ABNORMAL LOW (ref 8.9–10.3)
Chloride: 95 mmol/L — ABNORMAL LOW (ref 98–111)
Creatinine, Ser: 0.89 mg/dL (ref 0.61–1.24)
GFR, Estimated: >60 mL/min (ref >60)
Glucose, Bld: 62 mg/dL — ABNORMAL LOW (ref 70–99)
Potassium: 3.5 mmol/L (ref 3.5–5.1)
Sodium: 128 mmol/L — ABNORMAL LOW (ref 135–145)
Total Bilirubin: 2.1 mg/dL — ABNORMAL HIGH (ref 0.3–1.2)
Total Protein: 6.1 g/dL — ABNORMAL LOW (ref 6.5–8.1)

## 2022-12-23 LAB — LACTIC ACID, PLASMA: Lactic Acid, Venous: 0.8 mmol/L (ref 0.5–1.9)

## 2022-12-23 LAB — HEMOGLOBIN A1C
Hgb A1c MFr Bld: 5.2 % (ref 4.8–5.6)
Mean Plasma Glucose: 102.54 mg/dL

## 2022-12-23 LAB — VITAMIN B12: Vitamin B-12: 425 pg/mL (ref 180–914)

## 2022-12-23 LAB — TSH: TSH: 2.396 u[IU]/mL (ref 0.350–4.500)

## 2022-12-23 LAB — HIV ANTIBODY (ROUTINE TESTING W REFLEX): HIV Screen 4th Generation wRfx: NONREACTIVE

## 2022-12-23 MED ORDER — VITAMIN B-1 100 MG PO TABS: 100.0000 mg | ORAL_TABLET | Freq: Every day | ORAL | 0 refills | Status: DC

## 2022-12-23 MED ORDER — NICOTINE 21 MG/24HR TD PT24: 21.0000 mg | MEDICATED_PATCH | Freq: Every day | TRANSDERMAL | 0 refills | Status: DC

## 2022-12-23 MED ORDER — FOLIC ACID 1 MG PO TABS: 1.0000 mg | ORAL_TABLET | Freq: Every day | ORAL | 0 refills | Status: DC

## 2022-12-23 NOTE — Progress Notes (Signed)
CSW added substance abuse resources to patient's AVS.  Chassidy Layson, MSW, LCSW Transitions of Care  Clinical Social Worker II [phone]  

## 2022-12-23 NOTE — ED Notes (Signed)
ED TO INPATIENT HANDOFF REPORT  ED Nurse Name and Phone #:  Theophilus Bones [phone]  S Name/Age/Gender Chad Golden 62 y.o. male Room/Bed: 042C/042C  Code Status   Code Status: Full Code  Home/SNF/Other Home Patient oriented to: self, place, time, and situation Is this baseline? Yes   Triage Complete: Triage complete  Chief Complaint Altered mental status [R41.82]  Triage Note Found by family laying in the yard very altered incontinent.  Per ems GCS 12 on arrival currently 15 has had incontinence of urine wants to go home at this time.   Per family report he has been clean for a few months but reports substance abuse in the passed    Allergies Allergies  Allergen Reactions   Milk-Related Compounds Nausea And Vomiting    Level of Care/Admitting Diagnosis ED Disposition     ED Disposition  Admit   Condition  --   Comment  Hospital Area: [facility] [[id number]]  Level of Care: Telemetry Medical [104]  May place patient in observation at [facility] or [facility] if equivalent level of care is available:: No  Covid Evaluation: Asymptomatic - no recent exposure (last 10 days) testing not required  Diagnosis: Altered mental status [780.97.ICD-9-CM]  Admitting Physician: Joycelyn Das [[id number]]  Attending Physician: Joycelyn Das [[id number]]          B Medical/Surgery History Past Medical History:  Diagnosis Date   Alcohol abuse    Seizures (HCC)    None in the last 15 years   No past surgical history on file.   A IV Location/Drains/Wounds Patient Lines/Drains/Airways Status     Active Line/Drains/Airways     Name Placement date Placement time Site Days   Peripheral IV 12/22/22 18 G Left Antecubital 12/22/22  1256  Antecubital  1   Wound 01/06/12 Other (Comment) Head Left red raised area above pt left brow 01/06/12  1145  Head  4004            Intake/Output Last 24 hours No intake or output data in the 24 hours ending 12/23/22  0710  Labs/Imaging Results for orders placed or performed during the hospital encounter of 12/22/22 (from the past 48 hour(s))  Urinalysis, Routine w reflex microscopic -Urine, Clean Catch     Status: Abnormal   Collection Time: 12/22/22  1:11 PM  Result Value Ref Range   Color, Urine YELLOW YELLOW   APPearance CLEAR CLEAR   Specific Gravity, Urine 1.016 1.005 - 1.030   pH 5.0 5.0 - 8.0   Glucose, UA NEGATIVE NEGATIVE mg/dL   Hgb urine dipstick MODERATE (A) NEGATIVE   Bilirubin Urine NEGATIVE NEGATIVE   Ketones, ur 20 (A) NEGATIVE mg/dL   Protein, ur 956 (A) NEGATIVE mg/dL   Nitrite NEGATIVE NEGATIVE   Leukocytes,Ua NEGATIVE NEGATIVE   RBC / HPF 0-5 0 - 5 RBC/hpf   WBC, UA 0-5 0 - 5 WBC/hpf   Bacteria, UA NONE SEEN NONE SEEN   Squamous Epithelial / HPF 0-5 0 - 5 /HPF   Mucus PRESENT     Comment: Performed at [facility] Lab, [address] [place], [place] [address]  Urine rapid drug screen (hosp performed)     Status: None   Collection Time: 12/22/22  1:11 PM  Result Value Ref Range   Opiates NONE DETECTED NONE DETECTED   Cocaine NONE DETECTED NONE DETECTED   Benzodiazepines NONE DETECTED NONE DETECTED   Amphetamines NONE DETECTED NONE DETECTED   Tetrahydrocannabinol NONE DETECTED NONE  DETECTED   Barbiturates NONE DETECTED NONE DETECTED    Comment: (NOTE) DRUG SCREEN FOR MEDICAL PURPOSES ONLY.  IF CONFIRMATION IS NEEDED FOR ANY PURPOSE, NOTIFY LAB WITHIN 5 DAYS.  LOWEST DETECTABLE LIMITS FOR URINE DRUG SCREEN Drug Class                     Cutoff (ng/mL) Amphetamine and metabolites    1000 Barbiturate and metabolites    200 Benzodiazepine                 200 Opiates and metabolites        300 Cocaine and metabolites        300 THC                            50 Performed at [facility], [address] [place] [address] [place], [place] [address]   Comprehensive metabolic panel     Status: Abnormal   Collection Time: 12/22/22  1:28 PM  Result Value Ref Range   Sodium  131 (L) 135 - 145 mmol/L   Potassium 3.7 3.5 - 5.1 mmol/L   Chloride 96 (L) 98 - 111 mmol/L   CO2 11 (L) 22 - 32 mmol/L   Glucose, Bld 100 (H) 70 - 99 mg/dL    Comment: Glucose reference range applies only to samples taken after fasting for at least 8 hours.   BUN 8 8 - 23 mg/dL   Creatinine, Ser 0.98 0.61 - 1.24 mg/dL   Calcium 8.8 (L) 8.9 - 10.3 mg/dL   Total Protein 7.0 6.5 - 8.1 g/dL   Albumin 3.8 3.5 - 5.0 g/dL   AST 119 (H) 15 - 41 U/L   ALT 87 (H) 0 - 44 U/L   Alkaline Phosphatase 84 38 - 126 U/L   Total Bilirubin 1.9 (H) 0.3 - 1.2 mg/dL   GFR, Estimated >14 >78 mL/min    Comment: (NOTE) Calculated using the CKD-EPI Creatinine Equation (2021)    Anion gap 24 (H) 5 - 15    Comment: ELECTROLYTES REPEATED TO VERIFY Performed at [facility], [address] [place], [place] [address]   CBC with Differential     Status: Abnormal   Collection Time: 12/22/22  1:28 PM  Result Value Ref Range   WBC 4.9 4.0 - 10.5 K/uL   RBC 4.33 4.22 - 5.81 MIL/uL   Hemoglobin 15.4 13.0 - 17.0 g/dL   HCT 13.0 86.5 - 78.4 %   MCV 100.5 (H) 80.0 - 100.0 fL   MCH 35.6 (H) 26.0 - 34.0 pg   MCHC 35.4 30.0 - 36.0 g/dL   RDW 69.6 (H) 29.5 - 28.4 %   Platelets 61 (L) 150 - 400 K/uL    Comment: SPECIMEN CHECKED FOR CLOTS Immature Platelet Fraction may be clinically indicated, consider ordering this additional test [id number] REPEATED TO VERIFY    nRBC 0.0 0.0 - 0.2 %   Neutrophils Relative % 70 %   Neutro Abs 3.4 1.7 - 7.7 K/uL   Lymphocytes Relative 22 %   Lymphs Abs 1.1 0.7 - 4.0 K/uL   Monocytes Relative 7 %   Monocytes Absolute 0.4 0.1 - 1.0 K/uL   Eosinophils Relative 0 %   Eosinophils Absolute 0.0 0.0 - 0.5 K/uL   Basophils Relative 1 %   Basophils Absolute 0.1 0.0 - 0.1 K/uL   Immature Granulocytes 0 %   Abs Immature Granulocytes 0.02  0.00 - 0.07 K/uL    Comment: Performed at [facility] Lab, [address], [place], [place] [address]  Ethanol     Status: None   Collection  Time: 12/22/22  1:30 PM  Result Value Ref Range   Alcohol, Ethyl (B) <10 <10 mg/dL    Comment: (NOTE) Lowest detectable limit for serum alcohol is 10 mg/dL.  For medical purposes only. Performed at [facility], [address], [place], [place] [address]   I-Stat venous blood gas, [facility], [facility], [facility])     Status: Abnormal   Collection Time: 12/22/22  2:25 PM  Result Value Ref Range   pH, Ven 7.318 7.25 - 7.43   pCO2, Ven 21.8 (L) 44 - 60 mmHg   pO2, Ven 122 (H) 32 - 45 mmHg   Bicarbonate 11.2 (L) 20.0 - 28.0 mmol/L   TCO2 12 (L) 22 - 32 mmol/L   O2 Saturation 99 %   Acid-base deficit 12.0 (H) 0.0 - 2.0 mmol/L   Sodium 130 (L) 135 - 145 mmol/L   Potassium 3.7 3.5 - 5.1 mmol/L   Calcium, Ion 1.04 (L) 1.15 - 1.40 mmol/L   HCT 50.0 39.0 - 52.0 %   Hemoglobin 17.0 13.0 - 17.0 g/dL   Sample type VENOUS   Osmolality     Status: None   Collection Time: 12/22/22  3:00 PM  Result Value Ref Range   Osmolality 277 275 - 295 mOsm/kg    Comment: Performed at [facility], [address] [place], [place] [address]  Lactic acid, plasma     Status: Abnormal   Collection Time: 12/22/22  3:27 PM  Result Value Ref Range   Lactic Acid, Venous 4.8 (HH) 0.5 - 1.9 mmol/L    Comment: CRITICAL RESULT CALLED TO, READ BACK BY AND VERIFIED WITH T,SOUTHARD RN @1755  12/22/22 E,BENTON Performed at [facility], [address], [place], [place] [address]   CBG monitoring, ED     Status: None   Collection Time: 12/22/22  3:29 PM  Result Value Ref Range   Glucose-Capillary 90 70 - 99 mg/dL    Comment: Glucose reference range applies only to samples taken after fasting for at least 8 hours.  Lactic acid, plasma     Status: None   Collection Time: 12/23/22  3:55 AM  Result Value Ref Range   Lactic Acid, Venous 0.8 0.5 - 1.9 mmol/L    Comment: Performed at [facility], [address], [place], [place] [address]  TSH     Status: None   Collection Time: 12/23/22  3:55 AM  Result Value  Ref Range   TSH 2.396 0.350 - 4.500 uIU/mL    Comment: Performed by a 3rd Generation assay with a functional sensitivity of <=0.01 uIU/mL. Performed at [facility], [address], [place], [place] [address]   Vitamin B12     Status: None   Collection Time: 12/23/22  3:55 AM  Result Value Ref Range   Vitamin B-12 425 180 - 914 pg/mL    Comment: (NOTE) This assay is not validated for testing neonatal or myeloproliferative syndrome specimens for Vitamin B12 levels. Performed at [facility], [address], [place], [place] [address]   Hemoglobin A1c     Status: None   Collection Time: 12/23/22  3:55 AM  Result Value Ref Range   Hgb A1c MFr Bld 5.2 4.8 - 5.6 %    Comment: (  NOTE) Pre diabetes:          5.7%-6.4%  Diabetes:              >6.4%  Glycemic control for   <7.0% adults with diabetes    Mean Plasma Glucose 102.54 mg/dL    Comment: Performed at [facility], [address] [place], [place] [address]  Lipid panel     Status: Abnormal   Collection Time: 12/23/22  3:55 AM  Result Value Ref Range   Cholesterol 207 (H) 0 - 200 mg/dL   Triglycerides 99 <604 mg/dL   HDL 71 >54 mg/dL   Total CHOL/HDL Ratio 2.9 RATIO   VLDL 20 0 - 40 mg/dL   LDL Cholesterol 098 (H) 0 - 99 mg/dL    Comment:        Total Cholesterol/HDL:CHD Risk Coronary Heart Disease Risk Table                     Men   Women  1/2 Average Risk   3.4   3.3  Average Risk       5.0   4.4  2 X Average Risk   9.6   7.1  3 X Average Risk  23.4   11.0        Use the calculated Patient Ratio above and the CHD Risk Table to determine the patient's CHD Risk.        ATP III CLASSIFICATION (LDL):  <100     mg/dL   Optimal  119-147  mg/dL   Near or Above                    Optimal  130-159  mg/dL   Borderline  829-562  mg/dL   High  >130     mg/dL   Very High Performed at [facility], [address], [place], [place] [address]   Comprehensive metabolic panel     Status: Abnormal    Collection Time: 12/23/22  3:55 AM  Result Value Ref Range   Sodium 128 (L) 135 - 145 mmol/L   Potassium 3.5 3.5 - 5.1 mmol/L   Chloride 95 (L) 98 - 111 mmol/L   CO2 17 (L) 22 - 32 mmol/L   Glucose, Bld 62 (L) 70 - 99 mg/dL    Comment: Glucose reference range applies only to samples taken after fasting for at least 8 hours.   BUN 7 (L) 8 - 23 mg/dL   Creatinine, Ser 4.69 0.61 - 1.24 mg/dL   Calcium 8.2 (L) 8.9 - 10.3 mg/dL   Total Protein 6.1 (L) 6.5 - 8.1 g/dL   Albumin 3.0 (L) 3.5 - 5.0 g/dL   AST 96 (H) 15 - 41 U/L   ALT 62 (H) 0 - 44 U/L   Alkaline Phosphatase 60 38 - 126 U/L   Total Bilirubin 2.1 (H) 0.3 - 1.2 mg/dL   GFR, Estimated >62 >95 mL/min    Comment: (NOTE) Calculated using the CKD-EPI Creatinine Equation (2021)    Anion gap 16 (H) 5 - 15    Comment: Performed at [facility], [address], [place], [place] [address]  CBC     Status: Abnormal   Collection Time: 12/23/22  3:55 AM  Result Value Ref Range   WBC 5.0 4.0 - 10.5 K/uL   RBC 3.78 (L) 4.22 - 5.81 MIL/uL   Hemoglobin 13.6 13.0 - 17.0 g/dL   HCT 24.4 (L)  39.0 - 52.0 %   MCV 98.9 80.0 - 100.0 fL   MCH 36.0 (H) 26.0 - 34.0 pg   MCHC 36.4 (H) 30.0 - 36.0 g/dL   RDW 14.7 82.9 - 56.2 %   Platelets 54 (L) 150 - 400 K/uL    Comment: Immature Platelet Fraction may be clinically indicated, consider ordering this additional test [id number] REPEATED TO VERIFY    nRBC 0.0 0.0 - 0.2 %    Comment: Performed at [facility], [address] [place], [place] [address]  Protime-INR     Status: None   Collection Time: 12/23/22  3:55 AM  Result Value Ref Range   Prothrombin Time 14.1 11.4 - 15.2 seconds   INR 1.1 0.8 - 1.2    Comment: (NOTE) INR goal varies based on device and disease states. Performed at [facility], [address] [place], [place] [address]   Magnesium     Status: None   Collection Time: 12/23/22  3:55 AM  Result Value Ref Range   Magnesium 1.7 1.7 - 2.4 mg/dL     Comment: Performed at [facility], [address], [place], [place] [address]   EEG adult  Result Date: 12/22/2022 Rejeana Brock, MD     12/22/2022  9:09 PM History: 62 yo M with a history of seizures presenting with AMS Sedation: none Technique: This EEG was acquired with electrodes placed according to the International 10-20 electrode system (including Fp1, Fp2, F3, F4, C3, C4, P3, P4, O1, O2, T3, T4, T5, T6, A1, A2, Fz, Cz, Pz). The following electrodes were missing or displaced: none. Background: The background consists of intermixed alpha and beta activities. There is a well defined posterior dominant rhythm of 9-10 Hz that attenuates with eye opening. Sleep is not recorded . Photic stimulation: Physiologic driving is not performed EEG Abnormalities: none Clinical Interpretation: This normal EEG is recorded in the waking state. There was no seizure or seizure predisposition recorded on this study. Please note that lack of epileptiform activity on EEG does not preclude the possibility of epilepsy. Ritta Slot, MD [facility] [phone] If 7pm- 7am, please page neurology on call as listed in AMION.   CT Head Wo Contrast  Result Date: 12/22/2022 CLINICAL DATA:  [name].  Head trauma. EXAM: CT HEAD WITHOUT CONTRAST CT CERVICAL SPINE WITHOUT CONTRAST TECHNIQUE: Multidetector CT imaging of the head and cervical spine was performed following the standard protocol without intravenous contrast. Multiplanar CT image reconstructions of the cervical spine were also generated. RADIATION DOSE REDUCTION: This exam was performed according to the departmental dose-optimization program which includes automated exposure control, adjustment of the mA and/or kV according to patient size and/or use of iterative reconstruction technique. COMPARISON:  Head CT 09/27/2014 FINDINGS: CT HEAD FINDINGS Brain: Mild age advanced cerebral atrophy, ventriculomegaly and periventricular white matter  disease. No acute intracranial findings such as hemispheric infarction or intracranial hemorrhage. No mass lesions. Brainstem and cerebellum are grossly normal. There are mild changes of cerebellar atrophy. Vascular: Vascular calcifications but no aneurysm or hyperdense vessels. Skull: No skull fracture or bone lesions. Sinuses/Orbits: No acute paranasal sinus disease. Small mucous retention cyst or polyp noted in the right maxillary sinus. 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 The globes are intact. Other: Enlarging fatty lesion in the left frontal scalp area likely benign scalp lipoma. CT CERVICAL SPINE FINDINGS Alignment: Normal overall alignment. Skull base and vertebrae: No acute  fracture. No primary bone lesion or focal pathologic process. Soft tissues and spinal canal: No prevertebral fluid or swelling. No visible canal hematoma. Disc levels: Advanced degenerative cervical spondylosis with advanced degenerative disc disease at C4-5, C5-6 and C6-7. No significant canal stenosis. Mild bony foraminal stenosis at these levels due to uncinate spurring and facet disease. Upper chest: The lung apices demonstrate emphysematous changes. Other: Bilateral carotid artery calcifications. IMPRESSION: 1. Age advanced cerebral atrophy, ventriculomegaly and periventricular white matter disease. 2. No acute intracranial findings or skull fracture. 3. Advanced degenerative cervical spondylosis but no acute cervical spine fracture. Electronically Signed   By: Rudie Meyer M.D.   On: 12/22/2022 14:37   CT Cervical Spine Wo Contrast  Result Date: 12/22/2022 CLINICAL DATA:  [name].  Head trauma. EXAM: CT HEAD WITHOUT CONTRAST CT CERVICAL SPINE WITHOUT CONTRAST TECHNIQUE: Multidetector CT imaging of the head and cervical spine was performed following the standard protocol without intravenous contrast. Multiplanar CT image reconstructions of the cervical spine were also  generated. RADIATION DOSE REDUCTION: This exam was performed according to the departmental dose-optimization program which includes automated exposure control, adjustment of the mA and/or kV according to patient size and/or use of iterative reconstruction technique. COMPARISON:  Head CT 09/27/2014 FINDINGS: CT HEAD FINDINGS Brain: Mild age advanced cerebral atrophy, ventriculomegaly and periventricular white matter disease. No acute intracranial findings such as hemispheric infarction or intracranial hemorrhage. No mass lesions. Brainstem and cerebellum are grossly normal. There are mild changes of cerebellar atrophy. Vascular: Vascular calcifications but no aneurysm or hyperdense vessels. Skull: No skull fracture or bone lesions. Sinuses/Orbits: No acute paranasal sinus disease. Small mucous retention cyst or polyp noted in the right maxillary sinus. 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 The globes are intact. Other: Enlarging fatty lesion in the left frontal scalp area likely benign scalp lipoma. CT CERVICAL SPINE FINDINGS Alignment: Normal overall alignment. Skull base and vertebrae: No acute fracture. No primary bone lesion or focal pathologic process. Soft tissues and spinal canal: No prevertebral fluid or swelling. No visible canal hematoma. Disc levels: Advanced degenerative cervical spondylosis with advanced degenerative disc disease at C4-5, C5-6 and C6-7. No significant canal stenosis. Mild bony foraminal stenosis at these levels due to uncinate spurring and facet disease. Upper chest: The lung apices demonstrate emphysematous changes. Other: Bilateral carotid artery calcifications. IMPRESSION: 1. Age advanced cerebral atrophy, ventriculomegaly and periventricular white matter disease. 2. No acute intracranial findings or skull fracture. 3. Advanced degenerative cervical spondylosis but no acute cervical spine fracture. Electronically Signed   By: Rudie Meyer M.D.   On: 12/22/2022 14:37    Pending Labs Unresulted Labs (From admission, onward)     Start     Ordered   12/22/22 1725  HIV Antibody (routine testing w rflx)  (HIV Antibody (Routine testing w reflex) panel)  Once,   R        12/22/22 1724   12/22/22 1438  Volatiles,Blood (acetone,ethanol,isoprop,methanol)  Once,   URGENT        12/22/22 1437            Vitals/Pain Today's Vitals   12/23/22 0135 12/23/22 0200 12/23/22 0230 12/23/22 0300  BP: 127/79 130/78 128/77 119/75  Pulse: 86 82 84 83  Resp: 17 16 15 13   Temp:      TempSrc:      SpO2: 98% 96% 93% 97%  Weight:      Height:  PainSc:        Isolation Precautions No active isolations  Medications Medications  LORazepam (ATIVAN) tablet 1-4 mg (has no administration in time range)    Or  LORazepam (ATIVAN) injection 1-4 mg (has no administration in time range)  thiamine (VITAMIN B1) tablet 100 mg (100 mg Oral Given 12/22/22 1521)    Or  thiamine (VITAMIN B1) injection 100 mg ( Intravenous See Alternative 12/22/22 1521)  folic acid (FOLVITE) tablet 1 mg (1 mg Oral Given 12/22/22 1521)  multivitamin with minerals tablet 1 tablet (1 tablet Oral Given 12/22/22 1521)  0.9 %  sodium chloride infusion ( Intravenous New Bag/Given 12/23/22 0707)  polyethylene glycol (MIRALAX / GLYCOLAX) packet 17 g (has no administration in time range)  ondansetron (ZOFRAN) tablet 4 mg (has no administration in time range)    Or  ondansetron (ZOFRAN) injection 4 mg (has no administration in time range)  hydrALAZINE (APRESOLINE) injection 5 mg (has no administration in time range)  polyvinyl alcohol (LIQUIFILM TEARS) 1.4 % ophthalmic solution 1 drop (has no administration in time range)  nicotine (NICODERM CQ - dosed in mg/24 hours) patch 21 mg (21 mg Transdermal Not Given 12/22/22 2200)  lactated ringers bolus 1,000 mL (1,000 mLs Intravenous Bolus 12/22/22 1318)  lactated ringers bolus 1,000 mL (0 mLs Intravenous Stopped 12/22/22  1709)    Mobility walks     Focused Assessments Neuro Assessment Handoff:  Swallow screen pass? Yes          Neuro Assessment: Within Defined Limits Neuro Checks:      Has TPA been given? No If patient is a Neuro Trauma and patient is going to OR before floor call report to 4N Charge nurse: [phone] or [phone]   R Recommendations: See Admitting Provider Note  Report given to:   Additional Notes:

## 2022-12-23 NOTE — ED Notes (Signed)
IV discontinued via the pt. IV fluids stopped at this time, pt requesting to be discharged. Dr Tyson Babinski aware.

## 2022-12-23 NOTE — Discharge Summary (Signed)
Physician Discharge Summary  Chad Golden:[id number] DOB: 1960/10/17 DOA: 12/22/2022  PCP: Patient, No Pcp Per  Admit date: 12/22/2022 Discharge date: 12/23/2022  Admitted From: Home  Discharge disposition: Home   Recommendations for Outpatient Follow-Up:   Follow up with your primary care provider in one week.  Check CBC, BMP, magnesium in the next visit Should be counseled against this drinking.   Discharge Diagnosis:   Principal Problem:   Altered mental status Active Problems:   Alcohol abuse   Seizures  Discharge Condition: Improved.  Diet recommendation:   Regular.  Wound care: None.  Code status: Full.   History of Present Illness:   Patient is a 62 year old male with past medical history of alcohol abuse and history of seizures presented to hospital with altered mental status.  Patient was found to be outside in the grass and EMS was called in.  Patient's sister reported that patient did have some froth in his mouth as reported by bystanders.  Patient does drink daily and has not had a drink in the last 2 days.  He was trying to quit as per the patient.  Patient was also noted to have urinary incontinence but patient did not remember what happened to him.    Patient has history of seizures in the past thought to be secondary to alcohol withdrawal and is not on antiepileptics.  In the ED, Patient was initially was somnolent.  Bicarb was low at 11.  Anion gap of 24.  CT head was positive for cerebral atrophy and ventriculomegaly and degenerative changes in the cervical spine.  CT cervical spine with degenerative changes.  Alcohol level was less than 10.  Patient received 2 L of IV fluid bolus in the ED, was started on CIWA protocol and was admitted hospital for further evaluation and treatment.    Hospital Course:   Following conditions were addressed during hospitalization as listed below,  Altered mental status.   Possibly postictal from seizures.   Resolved at this time.  Did not have any further seizures EEG of normal.  Patient wished to be discharged.  Was counseled against smoking. Vitamin B12 at 425 with TSH of 2.3.  Elevated lactate.  Likely secondary to seizures.  Has improved with IV fluids.     History of seizures.  Found outside in the grass.  Likely alcohol withdrawal seizures.  Had incontinence as well.  Patient did not have further seizures in the hospital.  EEG was unremarkable.   History of alcohol use disorder with elevated LFTs.  Continue thiamine, folic acid and multivitamins.  Continue CIWA protocol.  TOC consult for substance abuse.  Patient received CIWA protocol during hospitalization.  At this time patient remains stable.  No overt withdrawal symptoms.     Thrombocytopenia likely secondary to chronic liver disease and alcohol use.  Advised against alcohol.   Macrocytosis likely secondary to chronic alcohol use.  TSH and vitamin B12 within normal range.  Hemoglobin on presentation at 15.4 repeat was 13.6.   Mild hyponatremia.  Secondary to alcohol usage.  Latest sodium at 128.  Patient asymptomatic.  Advised against alcohol.   History of cigarette smoking.  Advised to quit.  Will prescribe nicotine patch on discharge.  Disposition.  At this time, patient is stable for disposition home with outpatient PCP follow-up.  Medical Consultants:   None.  Procedures:    EEG Subjective:   Today, patient was seen and examined at bedside.  Feels okay.  No headache dizziness  lightheadedness sweating tremors no mention of seizures overnight.  Patient strongly wishes to go home.  Discharge Exam:   Vitals:   12/23/22 0845 12/23/22 0915  BP: (!) 109/96 (!) 105/40  Pulse: 79 89  Resp: 18   Temp:    SpO2: 100% 97%   Vitals:   12/23/22 0715 12/23/22 0830 12/23/22 0845 12/23/22 0915  BP: (!) 118/94 (!) 122/90 (!) 109/96 (!) 105/40  Pulse: 91 84 79 89  Resp: 17 (!) 21 18   Temp: 98 F (36.7 C)     TempSrc: Oral      SpO2: 100% 100% 100% 97%  Weight:      Height:        General: Alert awake, not in obvious distress HENT: pupils equally reacting to light,  No scleral pallor or icterus noted. Oral mucosa is moist.  Chest:  Clear breath sounds.  Diminished breath sounds bilaterally. No crackles or wheezes.  CVS: S1 &S2 heard. No murmur.  Regular rate and rhythm. Abdomen: Soft, nontender, nondistended.  Bowel sounds are heard.   Extremities: No cyanosis, clubbing or edema.  Peripheral pulses are palpable. Psych: Alert, awake and oriented, normal mood CNS:  No cranial nerve deficits.  Power equal in all extremities.   Skin: Warm and dry.  No rashes noted.  The results of significant diagnostics from this hospitalization (including imaging, microbiology, ancillary and laboratory) are listed below for reference.     Diagnostic Studies:   EEG adult  Result Date: 12/22/2022 Rejeana Brock, MD     12/22/2022  9:09 PM History: 62 yo M with a history of seizures presenting with AMS Sedation: none Technique: This EEG was acquired with electrodes placed according to the International 10-20 electrode system (including Fp1, Fp2, F3, F4, C3, C4, P3, P4, O1, O2, T3, T4, T5, T6, A1, A2, Fz, Cz, Pz). The following electrodes were missing or displaced: none. Background: The background consists of intermixed alpha and beta activities. There is a well defined posterior dominant rhythm of 9-10 Hz that attenuates with eye opening. Sleep is not recorded . Photic stimulation: Physiologic driving is not performed EEG Abnormalities: none Clinical Interpretation: This normal EEG is recorded in the waking state. There was no seizure or seizure predisposition recorded on this study. Please note that lack of epileptiform activity on EEG does not preclude the possibility of epilepsy. Ritta Slot, MD [facility] [phone] If 7pm- 7am, please page neurology on call as listed in [facility].   CT Head Wo  Contrast  Result Date: 12/22/2022 CLINICAL DATA:  [name].  Head trauma. EXAM: CT HEAD WITHOUT CONTRAST CT CERVICAL SPINE WITHOUT CONTRAST TECHNIQUE: Multidetector CT imaging of the head and cervical spine was performed following the standard protocol without intravenous contrast. Multiplanar CT image reconstructions of the cervical spine were also generated. RADIATION DOSE REDUCTION: This exam was performed according to the departmental dose-optimization program which includes automated exposure control, adjustment of the mA and/or kV according to patient size and/or use of iterative reconstruction technique. COMPARISON:  Head CT 09/27/2014 FINDINGS: CT HEAD FINDINGS Brain: Mild age advanced cerebral atrophy, ventriculomegaly and periventricular white matter disease. No acute intracranial findings such as hemispheric infarction or intracranial hemorrhage. No mass lesions. Brainstem and cerebellum are grossly normal. There are mild changes of cerebellar atrophy. Vascular: Vascular calcifications but no aneurysm or hyperdense vessels. Skull: No skull fracture or bone lesions. Sinuses/Orbits: No acute paranasal sinus disease. Small mucous retention cyst or polyp noted in the right maxillary sinus. 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 The globes are intact. Other: Enlarging fatty lesion in the left frontal scalp area likely benign scalp lipoma. CT CERVICAL SPINE FINDINGS Alignment: Normal overall alignment. Skull base and vertebrae: No acute fracture. No primary bone lesion or focal pathologic process. Soft tissues and spinal canal: No prevertebral fluid or swelling. No visible canal hematoma. Disc levels: Advanced degenerative cervical spondylosis with advanced degenerative disc disease at C4-5, C5-6 and C6-7. No significant canal stenosis. Mild bony foraminal stenosis at these levels due to uncinate spurring and facet disease. Upper chest: The lung apices  demonstrate emphysematous changes. Other: Bilateral carotid artery calcifications. IMPRESSION: 1. Age advanced cerebral atrophy, ventriculomegaly and periventricular white matter disease. 2. No acute intracranial findings or skull fracture. 3. Advanced degenerative cervical spondylosis but no acute cervical spine fracture. Electronically Signed   By: Rudie Meyer M.D.   On: 12/22/2022 14:37   CT Cervical Spine Wo Contrast  Result Date: 12/22/2022 CLINICAL DATA:  [name].  Head trauma. EXAM: CT HEAD WITHOUT CONTRAST CT CERVICAL SPINE WITHOUT CONTRAST TECHNIQUE: Multidetector CT imaging of the head and cervical spine was performed following the standard protocol without intravenous contrast. Multiplanar CT image reconstructions of the cervical spine were also generated. RADIATION DOSE REDUCTION: This exam was performed according to the departmental dose-optimization program which includes automated exposure control, adjustment of the mA and/or kV according to patient size and/or use of iterative reconstruction technique. COMPARISON:  Head CT 09/27/2014 FINDINGS: CT HEAD FINDINGS Brain: Mild age advanced cerebral atrophy, ventriculomegaly and periventricular white matter disease. No acute intracranial findings such as hemispheric infarction or intracranial hemorrhage. No mass lesions. Brainstem and cerebellum are grossly normal. There are mild changes of cerebellar atrophy. Vascular: Vascular calcifications but no aneurysm or hyperdense vessels. Skull: No skull fracture or bone lesions. Sinuses/Orbits: No acute paranasal sinus disease. Small mucous retention cyst or polyp noted in the right maxillary sinus. 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 The globes are intact. Other: Enlarging fatty lesion in the left frontal scalp area likely benign scalp lipoma. CT CERVICAL SPINE FINDINGS Alignment: Normal overall alignment. Skull base and vertebrae: No acute fracture.  No primary bone lesion or focal pathologic process. Soft tissues and spinal canal: No prevertebral fluid or swelling. No visible canal hematoma. Disc levels: Advanced degenerative cervical spondylosis with advanced degenerative disc disease at C4-5, C5-6 and C6-7. No significant canal stenosis. Mild bony foraminal stenosis at these levels due to uncinate spurring and facet disease. Upper chest: The lung apices demonstrate emphysematous changes. Other: Bilateral carotid artery calcifications. IMPRESSION: 1. Age advanced cerebral atrophy, ventriculomegaly and periventricular white matter disease. 2. No acute intracranial findings or skull fracture. 3. Advanced degenerative cervical spondylosis but no acute cervical spine fracture. Electronically Signed   By: Rudie Meyer M.D.   On: 12/22/2022 14:37     Labs:   Basic Metabolic Panel: Recent Labs  Lab 12/22/22 1328 12/22/22 1425 12/23/22 0355  NA 131* 130* 128*  K 3.7 3.7 3.5  CL 96*  --  95*  CO2 11*  --  17*  GLUCOSE 100*  --  62*  BUN 8  --  7*  CREATININE 1.01  --  0.89  CALCIUM 8.8*  --  8.2*  MG  --   --  1.7   GFR Estimated Creatinine Clearance: 73.5 mL/min (by C-G formula based on SCr of 0.89 mg/dL). Liver Function Tests: Recent  Labs  Lab 12/22/22 1328 12/23/22 0355  AST 195* 96*  ALT 87* 62*  ALKPHOS 84 60  BILITOT 1.9* 2.1*  PROT 7.0 6.1*  ALBUMIN 3.8 3.0*   No results for input(s): "LIPASE", "AMYLASE" in the last 168 hours. No results for input(s): "AMMONIA" in the last 168 hours. Coagulation profile Recent Labs  Lab 12/23/22 0355  INR 1.1    CBC: Recent Labs  Lab 12/22/22 1328 12/22/22 1425 12/23/22 0355  WBC 4.9  --  5.0  NEUTROABS 3.4  --   --   HGB 15.4 17.0 13.6  HCT 43.5 50.0 37.4*  MCV 100.5*  --  98.9  PLT 61*  --  54*   Cardiac Enzymes: No results for input(s): "CKTOTAL", "CKMB", "CKMBINDEX", "TROPONINI" in the last 168 hours. BNP: Invalid input(s): "POCBNP" CBG: Recent Labs  Lab  12/22/22 1529  GLUCAP 90   D-Dimer No results for input(s): "DDIMER" in the last 72 hours. Hgb A1c Recent Labs    12/23/22 0355  HGBA1C 5.2   Lipid Profile Recent Labs    12/23/22 0355  CHOL 207*  HDL 71  LDLCALC 116*  TRIG 99  CHOLHDL 2.9   Thyroid function studies Recent Labs    12/23/22 0355  TSH 2.396   Anemia work up Recent Labs    12/23/22 0355  VITAMINB12 425   Microbiology No results found for this or any previous visit (from the past 240 hour(s)).   Discharge Instructions:   Discharge Instructions     Diet - low sodium heart healthy   Complete by: As directed    Discharge instructions   Complete by: As directed    Follow-up with your primary care provider in 1 week.  Please do not drink alcohol   Increase activity slowly   Complete by: As directed       Allergies as of 12/23/2022       Reactions   Milk-related Compounds Nausea And Vomiting        Medication List     TAKE these medications    acetaminophen 325 MG tablet Commonly known as: TYLENOL Take 650 mg by mouth every 6 (six) hours as needed for mild pain.   folic acid 1 MG tablet Commonly known as: FOLVITE Take 1 tablet (1 mg total) by mouth daily.   nicotine 21 mg/24hr patch Commonly known as: NICODERM CQ - dosed in mg/24 hours Place 1 patch (21 mg total) onto the skin daily. Start taking on: December 24, 2022   thiamine 100 MG tablet Commonly known as: Vitamin B-1 Take 1 tablet (100 mg total) by mouth daily. Start taking on: December 24, 2022        Follow-up Information     [facility]. Call.   Why: Please call to establilsh a primary care doctor. They have financial counselling and a pharmacy Contact information: [address] [employer] [address] [place] [address] [phone]                 Time coordinating discharge: 39 minutes  Signed:  Argelia Formisano  [facility] 12/23/2022, 4:57  PM

## 2022-12-31 ENCOUNTER — Ambulatory Visit (INDEPENDENT_AMBULATORY_CARE_PROVIDER_SITE_OTHER): Payer: Self-pay | Admitting: Nurse Practitioner

## 2022-12-31 ENCOUNTER — Encounter: Payer: Self-pay | Admitting: Nurse Practitioner

## 2022-12-31 VITALS — BP 83/58 | HR 103 | Temp 97.2°F | Ht 66.0 in | Wt 125.6 lb

## 2022-12-31 DIAGNOSIS — E871 Hypo-osmolality and hyponatremia: Secondary | ICD-10-CM

## 2022-12-31 DIAGNOSIS — Z716 Tobacco abuse counseling: Secondary | ICD-10-CM

## 2022-12-31 DIAGNOSIS — Z1211 Encounter for screening for malignant neoplasm of colon: Secondary | ICD-10-CM

## 2022-12-31 DIAGNOSIS — I959 Hypotension, unspecified: Secondary | ICD-10-CM

## 2022-12-31 DIAGNOSIS — R569 Unspecified convulsions: Secondary | ICD-10-CM

## 2022-12-31 DIAGNOSIS — R4182 Altered mental status, unspecified: Secondary | ICD-10-CM

## 2022-12-31 DIAGNOSIS — F1010 Alcohol abuse, uncomplicated: Secondary | ICD-10-CM

## 2022-12-31 DIAGNOSIS — Z09 Encounter for follow-up examination after completed treatment for conditions other than malignant neoplasm: Secondary | ICD-10-CM

## 2022-12-31 DIAGNOSIS — F32A Depression, unspecified: Secondary | ICD-10-CM

## 2022-12-31 NOTE — Assessment & Plan Note (Signed)
Patient is alert and oriented following, his mother is concerned about a being slow to processing things, has history of seizures , was on medications for seizures about 20 years ago, Will refer patient to neurology for further evaluation.  Recent imaging study shows Age advanced cerebral atrophy, ventriculomegaly and periventricular white matter disease. 2. No acute intracranial findings or skull fracture. 3. Advanced degenerative cervical spondylosis but no acute cervical spine fracture.

## 2022-12-31 NOTE — Patient Instructions (Addendum)
Please get your shingles vaccine, Tdap vaccine at the pharmacy.    It is important that you exercise regularly at least 30 minutes 5 times a week as tolerated  Think about what you will eat, plan ahead. Choose " clean, green, fresh or frozen" over canned, processed or packaged foods which are more sugary, salty and fatty. 70 to 75% of food eaten should be vegetables and fruit. Three meals at set times with snacks allowed between meals, but they must be fruit or vegetables. Aim to eat over a 12 hour period , example 7 am to 7 pm, and STOP after  your last meal of the day. Drink water,generally about 64 ounces per day, no other drink is as healthy. Fruit juice is best enjoyed in a healthy way, by EATING the fruit.  Thanks for choosing [facility] we consider it a privelige to serve you.

## 2022-12-31 NOTE — Assessment & Plan Note (Signed)
ED after visit summary Labs and imaging studies reviewed by me today

## 2022-12-31 NOTE — Assessment & Plan Note (Addendum)
Drinks 3 beers daily, need to avoid drinking alcohol due to risk of intoxication discussed. Take folic acid 1 mg daily, thiamine 100 mg daily Will recheck CMP  Lab Results  Component Value Date   NA 128 (L) 12/23/2022   K 3.5 12/23/2022   CO2 17 (L) 12/23/2022   GLUCOSE 62 (L) 12/23/2022   BUN 7 (L) 12/23/2022   CREATININE 0.89 12/23/2022   CALCIUM 8.2 (L) 12/23/2022   GFRNONAA >60 12/23/2022

## 2022-12-31 NOTE — Progress Notes (Addendum)
New Patient Office Visit  Subjective:  Patient ID: Chad Golden, male    DOB: Nov 21, 1960  Age: 62 y.o. MRN: [id number]  CC:  Chief Complaint  Patient presents with   Hospitalization Follow-up    Per pt he is better just tired    HPI Chad Golden is a 62 y.o. male with past medical history of alcohol abuse, tobacco abuse, seizures who presents to establish care for his chronic medical conditions.  Patient was seen at the emergency room on 12/22/2022 for altered mental status.  He was transported to the emergency room via EMS after family found him in the grass. Patient was of mild confusion upon arrival to the emergency department He is accompanied by his mother who states that she has noticed that the patient is ''slow to processing things''. States that he drinks 3 beers daily.  Has history of seizures last episode was about 20 years ago currently not on medication.   Today the patient is alert and oriented x 4.  He denies fever, chills, nausea, vomiting, diarrhea dizziness.     Current smoker smokes half pack of cigarettes daily has chronic cough with phlegm.  He denies wheezing, shortness of breath, unintentional weight loss  Due for colon cancer screening Cologuard test ordered.  He was encouraged to get his shingles vaccine and Tdap vaccine at the pharmacy  Depression screening is positive but upon further inquiry the patient denies depression, will follow-up at next visit.  Past Medical History:  Diagnosis Date   Alcohol abuse    Seizures (HCC)    None in the last 15 years    History reviewed. No pertinent surgical history.  Family History  Problem Relation Age of Onset   Healthy Mother    Alcohol abuse Father     Social History   Socioeconomic History   Marital status: Single    Spouse name: Not on file   Number of children: 0   Years of education: 12   Highest education level: Not on file  Occupational History   Occupation: Consulting civil engineer    Comment:  Clinical biochemist  Tobacco Use   Smoking status: Every Day    Packs/day: 0.50    Years: 35.00    Additional pack years: 0.00    Total pack years: 17.50    Types: Cigarettes   Smokeless tobacco: Never  Substance and Sexual Activity   Alcohol use: Yes    Comment: Drinks couple beers after work daily   Drug use: No   Sexual activity: Not on file  Other Topics Concern   Not on file  Social History Narrative   Not on file   Social Determinants of Health   Financial Resource Strain: Not on file  Food Insecurity: No Food Insecurity (12/22/2022)   Hunger Vital Sign    Worried About Running Out of Food in the Last Year: Never true    Ran Out of Food in the Last Year: Never true  Transportation Needs: No Transportation Needs (12/22/2022)   PRAPARE - Administrator, Civil Service (Medical): No    Lack of Transportation (Non-Medical): No  Physical Activity: Not on file  Stress: Not on file  Social Connections: Not on file  Intimate Partner Violence: Not At Risk (12/22/2022)   Humiliation, Afraid, Rape, and Kick questionnaire    Fear of Current or Ex-Partner: No    Emotionally Abused: No    Physically Abused: No    Sexually Abused: No  ROS Review of Systems  Constitutional:  Negative for activity change, appetite change, chills, diaphoresis, fatigue, fever and unexpected weight change.  HENT:  Negative for congestion, dental problem, drooling and ear discharge.   Eyes:  Negative for pain, discharge, redness and itching.  Respiratory:  Negative for apnea, cough, choking, chest tightness, shortness of breath and wheezing.   Cardiovascular: Negative.  Negative for chest pain, palpitations and leg swelling.  Gastrointestinal:  Negative for abdominal distention, abdominal pain, anal bleeding, blood in stool, constipation, diarrhea and vomiting.  Endocrine: Negative for polydipsia, polyphagia and polyuria.  Genitourinary:  Negative for difficulty urinating, flank pain,  frequency and genital sores.  Musculoskeletal: Negative.  Negative for arthralgias, back pain, gait problem and joint swelling.  Skin:  Negative for color change, pallor and rash.  Neurological:  Negative for dizziness, facial asymmetry, light-headedness, numbness and headaches.  Psychiatric/Behavioral:  Negative for agitation, behavioral problems, confusion, hallucinations, self-injury, sleep disturbance and suicidal ideas.     Objective:   Today's Vitals: BP (!) 83/58   Pulse (!) 103   Temp (!) 97.2 F (36.2 C)   Ht 5\' 6"  (1.676 m)   Wt 125 lb 9.6 oz (57 kg)   SpO2 99%   BMI 20.27 kg/m   Physical Exam Vitals and nursing note reviewed.  Constitutional:      General: He is not in acute distress.    Appearance: Normal appearance. He is not ill-appearing, toxic-appearing or diaphoretic.     Comments: Patient is thin appearing   HENT:     Mouth/Throat:     Mouth: Mucous membranes are moist.     Pharynx: Oropharynx is clear. No oropharyngeal exudate or posterior oropharyngeal erythema.  Eyes:     General: No scleral icterus.       Right eye: No discharge.        Left eye: No discharge.     Extraocular Movements: Extraocular movements intact.     Conjunctiva/sclera: Conjunctivae normal.  Cardiovascular:     Rate and Rhythm: Normal rate and regular rhythm.     Pulses: Normal pulses.     Heart sounds: Normal heart sounds. No murmur heard.    No friction rub. No gallop.  Pulmonary:     Effort: Pulmonary effort is normal. No respiratory distress.     Breath sounds: Normal breath sounds. No stridor. No wheezing, rhonchi or rales.  Chest:     Chest wall: No tenderness.  Abdominal:     General: There is no distension.     Palpations: Abdomen is soft.     Tenderness: There is no abdominal tenderness. There is no right CVA tenderness, left CVA tenderness or guarding.  Musculoskeletal:        General: No swelling, tenderness, deformity or signs of injury.     Right lower leg: No  edema.     Left lower leg: No edema.  Skin:    General: Skin is warm and dry.     Capillary Refill: Capillary refill takes 2 to 3 seconds.     Coloration: Skin is not jaundiced or pale.     Findings: No bruising, erythema or lesion.  Neurological:     Mental Status: He is alert and oriented to person, place, and time.     Motor: No weakness.     Coordination: Coordination normal.     Gait: Gait normal.  Psychiatric:        Mood and Affect: Mood normal.  Behavior: Behavior normal.        Thought Content: Thought content normal.        Judgment: Judgment normal.     Assessment & Plan:   Problem List Items Addressed This Visit       Cardiovascular and Mediastinum   Hypotension    BP Readings from Last 3 Encounters:  12/31/22 (!) 83/58  12/23/22 (!) 105/40  03/08/22 116/80     Patient is asymptomatic He was encouraged to drink at least 64 ounces of water daily to maintain hydration.  Will consider starting patient on midodrine at next visit if his blood pressure remains low        Other   Altered mental status - Primary    Patient is alert and oriented following, his mother is concerned about a being slow to processing things, has history of seizures , was on medications for seizures about 20 years ago, Will refer patient to neurology for further evaluation.  Recent imaging study shows Age advanced cerebral atrophy, ventriculomegaly and periventricular white matter disease. 2. No acute intracranial findings or skull fracture. 3. Advanced degenerative cervical spondylosis but no acute cervical spine fracture.      Relevant Orders   Ambulatory referral to Neurology   Alcohol abuse    Drinks 3 beers daily, need to avoid drinking alcohol due to risk of intoxication discussed. Take folic acid 1 mg daily, thiamine 100 mg daily Will recheck CMP  Lab Results  Component Value Date   NA 128 (L) 12/23/2022   K 3.5 12/23/2022   CO2 17 (L) 12/23/2022   GLUCOSE 62  (L) 12/23/2022   BUN 7 (L) 12/23/2022   CREATININE 0.89 12/23/2022   CALCIUM 8.2 (L) 12/23/2022   GFRNONAA >60 12/23/2022         Relevant Orders   CMP14+EGFR   Seizures (HCC)    Was on medications about 20 years ago, currently not on meds Will refer patient to neurology      Relevant Orders   Ambulatory referral to Neurology   Hyponatremia    In the setting of alcohol abuse Need to avoid drinking alcohol discussed with the patient Rechecking CMP      Relevant Orders   CMP14+EGFR   Encounter for smoking cessation counseling    Smokes about 0.5 pack/day  Asked about quitting: confirms that he/she currently smokes cigarettes Advise to quit smoking: Educated about QUITTING to reduce the risk of cancer, cardio and cerebrovascular disease. Assess willingness: Unwilling to quit at this time, Assist with counseling and pharmacotherapy: Counseled for 5 minutes and literature provided. Arrange for follow up: follow up in 1 months and continue to offer help.       Depression    Flowsheet Row Office Visit from 12/31/2022 in [facility]  PHQ-9 Total Score 15     Positive for depression but the patient denies depression upon further evaluation. He denies SI, HI We will follow-up on this at his next appointment      Encounter for examination following treatment at hospital    ED after visit summary Labs and imaging studies reviewed by me today      Other Visit Diagnoses     Screening for colon cancer       Relevant Orders   Cologuard       Outpatient Encounter Medications as of 12/31/2022  Medication Sig   acetaminophen (TYLENOL) 325 MG tablet Take 650 mg by mouth every 6 (six) hours as  needed for mild pain.   folic acid (FOLVITE) 1 MG tablet Take 1 tablet (1 mg total) by mouth daily.   thiamine (VITAMIN B-1) 100 MG tablet Take 1 tablet (100 mg total) by mouth daily.   nicotine (NICODERM CQ - DOSED IN MG/24 HOURS) 21 mg/24hr patch Place 1 patch (21  mg total) onto the skin daily. (Patient not taking: Reported on 12/31/2022)   No facility-administered encounter medications on file as of 12/31/2022.    Follow-up: Return in about 4 weeks (around 01/28/2023) for FASTING LABS THIS WEEK.   Donell Beers, FNP

## 2022-12-31 NOTE — Assessment & Plan Note (Signed)
In the setting of alcohol abuse Need to avoid drinking alcohol discussed with the patient Rechecking CMP

## 2022-12-31 NOTE — Assessment & Plan Note (Addendum)
BP Readings from Last 3 Encounters:  12/31/22 (!) 83/58  12/23/22 (!) 105/40  03/08/22 116/80     Patient is asymptomatic He was encouraged to drink at least 64 ounces of water daily to maintain hydration.  Will consider starting patient on midodrine at next visit if his blood pressure remains low

## 2022-12-31 NOTE — Assessment & Plan Note (Signed)
Was on medications about 20 years ago, currently not on meds Will refer patient to neurology

## 2022-12-31 NOTE — Assessment & Plan Note (Signed)
Smokes about 0.5 pack/day  Asked about quitting: confirms that he/she currently smokes cigarettes Advise to quit smoking: Educated about QUITTING to reduce the risk of cancer, cardio and cerebrovascular disease. Assess willingness: Unwilling to quit at this time,Assist with counseling and pharmacotherapy: Counseled for 5 minutes and literature provided. Arrange for follow up: follow up in 1 months and continue to offer help. 

## 2022-12-31 NOTE — Assessment & Plan Note (Signed)
Flowsheet Row Office Visit from 12/31/2022 in [facility]  PHQ-9 Total Score 15     Positive for depression but the patient denies depression upon further evaluation. He denies SI, HI We will follow-up on this at his next appointment

## 2023-01-01 ENCOUNTER — Other Ambulatory Visit: Payer: Self-pay

## 2023-01-02 LAB — CMP14+EGFR
ALT: 20 IU/L (ref 0–44)
AST: 28 IU/L (ref 0–40)
Albumin/Globulin Ratio: 1.3 (ref 1.2–2.2)
Albumin: 3.8 g/dL — ABNORMAL LOW (ref 3.9–4.9)
Alkaline Phosphatase: 60 IU/L (ref 44–121)
BUN/Creatinine Ratio: 6 — ABNORMAL LOW (ref 10–24)
BUN: 4 mg/dL — ABNORMAL LOW (ref 8–27)
Bilirubin Total: <0.2 mg/dL (ref 0.0–1.2)
CO2: 17 mmol/L — ABNORMAL LOW (ref 20–29)
Calcium: 8.5 mg/dL — ABNORMAL LOW (ref 8.6–10.2)
Chloride: 106 mmol/L (ref 96–106)
Creatinine, Ser: 0.65 mg/dL — ABNORMAL LOW (ref 0.76–1.27)
Globulin, Total: 3 g/dL (ref 1.5–4.5)
Glucose: 98 mg/dL (ref 70–99)
Potassium: 4.2 mmol/L (ref 3.5–5.2)
Sodium: 140 mmol/L (ref 134–144)
Total Protein: 6.8 g/dL (ref 6.0–8.5)
eGFR: 106 mL/min/{1.73_m2} (ref >59)

## 2023-01-02 LAB — VOLATILES,BLD-ACETONE,ETHANOL,ISOPROP,METHANOL
Acetone, blood: <0.01 g/dL (ref 0.000–0.010)
Ethanol, blood: <0.01 g/dL (ref 0.000–0.010)
Isopropanol, blood: <0.01 g/dL (ref 0.000–0.010)
Methanol, blood: <0.01 g/dL (ref 0.000–0.010)

## 2023-01-03 ENCOUNTER — Observation Stay (HOSPITAL_COMMUNITY)
Admission: EM | Admit: 2023-01-03 | Discharge: 2023-01-05 | Disposition: A | Discharge status: Home / Self Care | Payer: BLUE CROSS/BLUE SHIELD | Attending: Internal Medicine | Admitting: Internal Medicine

## 2023-01-03 ENCOUNTER — Encounter (HOSPITAL_COMMUNITY): Payer: Self-pay

## 2023-01-03 ENCOUNTER — Other Ambulatory Visit: Payer: Self-pay

## 2023-01-03 DIAGNOSIS — F17210 Nicotine dependence, cigarettes, uncomplicated: Secondary | ICD-10-CM | POA: Insufficient documentation

## 2023-01-03 DIAGNOSIS — F10139 Alcohol abuse with withdrawal, unspecified: Secondary | ICD-10-CM | POA: Diagnosis not present

## 2023-01-03 DIAGNOSIS — Z720 Tobacco use: Secondary | ICD-10-CM | POA: Diagnosis not present

## 2023-01-03 DIAGNOSIS — F10939 Alcohol use, unspecified with withdrawal, unspecified: Secondary | ICD-10-CM | POA: Diagnosis present

## 2023-01-03 DIAGNOSIS — E871 Hypo-osmolality and hyponatremia: Secondary | ICD-10-CM | POA: Insufficient documentation

## 2023-01-03 DIAGNOSIS — R42 Dizziness and giddiness: Secondary | ICD-10-CM | POA: Diagnosis present

## 2023-01-03 DIAGNOSIS — Y908 Blood alcohol level of 240 mg/100 ml or more: Secondary | ICD-10-CM | POA: Insufficient documentation

## 2023-01-03 DIAGNOSIS — F1010 Alcohol abuse, uncomplicated: Principal | ICD-10-CM | POA: Diagnosis present

## 2023-01-03 DIAGNOSIS — F10929 Alcohol use, unspecified with intoxication, unspecified: Secondary | ICD-10-CM | POA: Diagnosis present

## 2023-01-03 DIAGNOSIS — M79601 Pain in right arm: Secondary | ICD-10-CM | POA: Diagnosis not present

## 2023-01-03 DIAGNOSIS — Z79899 Other long term (current) drug therapy: Secondary | ICD-10-CM | POA: Diagnosis not present

## 2023-01-03 LAB — COMPREHENSIVE METABOLIC PANEL
ALT: 20 U/L (ref 0–44)
AST: 31 U/L (ref 15–41)
Albumin: 4 g/dL (ref 3.5–5.0)
Alkaline Phosphatase: 52 U/L (ref 38–126)
Anion gap: 13 (ref 5–15)
BUN: <5 mg/dL — ABNORMAL LOW (ref 8–23)
CO2: 21 mmol/L — ABNORMAL LOW (ref 22–32)
Calcium: 8.8 mg/dL — ABNORMAL LOW (ref 8.9–10.3)
Chloride: 103 mmol/L (ref 98–111)
Creatinine, Ser: 0.69 mg/dL (ref 0.61–1.24)
GFR, Estimated: >60 mL/min (ref >60)
Glucose, Bld: 91 mg/dL (ref 70–99)
Potassium: 3.8 mmol/L (ref 3.5–5.1)
Sodium: 137 mmol/L (ref 135–145)
Total Bilirubin: 0.6 mg/dL (ref 0.3–1.2)
Total Protein: 8.4 g/dL — ABNORMAL HIGH (ref 6.5–8.1)

## 2023-01-03 LAB — CBC
HCT: 44.7 % (ref 39.0–52.0)
Hemoglobin: 15 g/dL (ref 13.0–17.0)
MCH: 35 pg — ABNORMAL HIGH (ref 26.0–34.0)
MCHC: 33.6 g/dL (ref 30.0–36.0)
MCV: 104.2 fL — ABNORMAL HIGH (ref 80.0–100.0)
Platelets: 237 10*3/uL (ref 150–400)
RBC: 4.29 MIL/uL (ref 4.22–5.81)
RDW: 14.6 % (ref 11.5–15.5)
WBC: 7.6 10*3/uL (ref 4.0–10.5)
nRBC: 0 % (ref 0.0–0.2)

## 2023-01-03 LAB — RAPID URINE DRUG SCREEN, HOSP PERFORMED
Amphetamines: NOT DETECTED
Barbiturates: NOT DETECTED
Benzodiazepines: NOT DETECTED
Cocaine: NOT DETECTED
Opiates: NOT DETECTED
Tetrahydrocannabinol: NOT DETECTED

## 2023-01-03 LAB — ETHANOL: Alcohol, Ethyl (B): 328 mg/dL (ref <10)

## 2023-01-03 LAB — MAGNESIUM: Magnesium: 2.1 mg/dL (ref 1.7–2.4)

## 2023-01-03 MED ORDER — ACETAMINOPHEN 650 MG RE SUPP: 650.0000 mg | Freq: Four times a day (QID) | RECTAL | Status: DC | PRN

## 2023-01-03 MED ORDER — LORAZEPAM 1 MG PO TABS: 0.0000 mg | ORAL_TABLET | Freq: Two times a day (BID) | ORAL | Status: DC

## 2023-01-03 MED ORDER — LORAZEPAM 1 MG PO TABS: 0.0000 mg | ORAL_TABLET | Freq: Four times a day (QID) | ORAL | Status: AC

## 2023-01-03 MED ORDER — FOLIC ACID 1 MG PO TABS
1.0000 mg | ORAL_TABLET | Freq: Every day | ORAL | Status: DC
  Administered 2023-01-03 – 2023-01-05 (×3): 1 mg via ORAL
  Filled 2023-01-03 (×3): qty 1

## 2023-01-03 MED ORDER — ONDANSETRON HCL 4 MG/2ML IJ SOLN: 4.0000 mg | Freq: Four times a day (QID) | INTRAMUSCULAR | Status: DC | PRN

## 2023-01-03 MED ORDER — MELATONIN 3 MG PO TABS
3.0000 mg | ORAL_TABLET | Freq: Every evening | ORAL | Status: DC | PRN
  Filled 2023-01-03: qty 1

## 2023-01-03 MED ORDER — PROSIGHT PO TABS
1.0000 | ORAL_TABLET | Freq: Every day | ORAL | Status: DC
  Administered 2023-01-04 – 2023-01-05 (×3): 1 via ORAL
  Filled 2023-01-03 (×4): qty 1

## 2023-01-03 MED ORDER — THIAMINE HCL 100 MG/ML IJ SOLN
100.0000 mg | Freq: Every day | INTRAMUSCULAR | Status: DC
  Administered 2023-01-03: 100 mg via INTRAVENOUS
  Filled 2023-01-03: qty 2

## 2023-01-03 MED ORDER — THIAMINE MONONITRATE 100 MG PO TABS
100.0000 mg | ORAL_TABLET | Freq: Every day | ORAL | Status: DC
  Administered 2023-01-04 – 2023-01-05 (×2): 100 mg via ORAL
  Filled 2023-01-03 (×3): qty 1

## 2023-01-03 MED ORDER — ACETAMINOPHEN 325 MG PO TABS
650.0000 mg | ORAL_TABLET | Freq: Four times a day (QID) | ORAL | Status: DC | PRN
  Administered 2023-01-04 – 2023-01-05 (×3): 650 mg via ORAL
  Filled 2023-01-03 (×3): qty 2

## 2023-01-03 MED ORDER — NICOTINE 14 MG/24HR TD PT24
14.0000 mg | MEDICATED_PATCH | Freq: Every day | TRANSDERMAL | Status: DC | PRN
  Administered 2023-01-04: 14 mg via TRANSDERMAL
  Filled 2023-01-03: qty 1

## 2023-01-03 MED ORDER — LORAZEPAM 2 MG/ML IJ SOLN
0.0000 mg | Freq: Four times a day (QID) | INTRAMUSCULAR | Status: AC
  Administered 2023-01-03: 2 mg via INTRAVENOUS
  Filled 2023-01-03: qty 1

## 2023-01-03 MED ORDER — SODIUM CHLORIDE 0.9 % IV BOLUS
1000.0000 mL | Freq: Once | INTRAVENOUS | Status: AC
  Administered 2023-01-03: 1000 mL via INTRAVENOUS

## 2023-01-03 MED ORDER — LORAZEPAM 2 MG/ML IJ SOLN: 0.0000 mg | Freq: Two times a day (BID) | INTRAMUSCULAR | Status: DC

## 2023-01-03 MED ORDER — LACTATED RINGERS IV SOLN: INTRAVENOUS | Status: AC

## 2023-01-03 NOTE — ED Provider Notes (Signed)
Clinical Course as of 01/03/23 Chad Golden Jan 03, 2023  1509 42 M recent admit for alcohol withdrawal. EtOH 328 today. TTS to see for resources for alcohol withdrawal.  [VB]  1907 Patient has been seen and evaluated by psychiatry.  Dr. Lucianne Muss evaluated patient recommending inpatient alcohol detox since history of alcohol withdrawal seizures.  They attempted inpatient admission at [facility] but no spots.  I am requesting hospitalist for admission for inpatient alcohol detox. [VB]  [id number] Spoke with Dr. Arlean Hopping hospitalist accepting patient for inpatient EtOH detox. [VB]    Clinical Course User Index [VB] Mardene Sayer, MD      Mardene Sayer, MD 01/03/23 Barry Brunner

## 2023-01-03 NOTE — ED Notes (Signed)
Pt belonging's bag (1) moved to locker 28

## 2023-01-03 NOTE — ED Notes (Signed)
Pt's mother's belongings returned and left.

## 2023-01-03 NOTE — ED Provider Notes (Signed)
[place] EMERGENCY DEPARTMENT AT [facility] Provider Note   CSN: [id number] Arrival date & time: 01/03/23  1028     History  Chief Complaint  Patient presents with   Alcohol Problem    Chad Golden is a 62 y.o. male.   Alcohol Problem  Patient brought in reportedly for low blood pressure.  States blood pressures in the 80s at home.  Feels dizzy at times.  Recent admission in the hospital for mental status changes thought to be due to seizure due to alcohol withdrawal.  Has had recent follow-up with PCP.  States feels bad at times.  Still has been drinking alcohol how however.  Potentially either drank yesterday or the day before.  However states he only drank a little bit.  No further seizures.  Patient presents with his mother.   discharge instructions  Past Medical History:  Diagnosis Date   Alcohol abuse    Seizures (HCC)    None in the last 15 years    Home Medications Prior to Admission medications   Medication Sig Start Date End Date Taking? Authorizing Provider  acetaminophen (TYLENOL) 325 MG tablet Take 650 mg by mouth every 6 (six) hours as needed for mild pain.    [provider]  folic acid (FOLVITE) 1 MG tablet Take 1 tablet (1 mg total) by mouth daily. 12/23/22 04/02/23  Pokhrel, Rebekah Chesterfield, MD  nicotine (NICODERM CQ - DOSED IN MG/24 HOURS) 21 mg/24hr patch Place 1 patch (21 mg total) onto the skin daily. Patient not taking: Reported on 12/31/2022 12/24/22   Joycelyn Das, MD  thiamine (VITAMIN B-1) 100 MG tablet Take 1 tablet (100 mg total) by mouth daily. 12/24/22 04/03/23  Pokhrel, Rebekah Chesterfield, MD      Allergies    Milk-related compounds    Review of Systems   Review of Systems  Physical Exam Updated Vital Signs BP 107/74 (BP Location: Left Arm)   Pulse 90   Temp 98.4 F (36.9 C) (Oral)   Resp 16   Wt 56 kg   SpO2 100%   BMI 19.93 kg/m  Physical Exam Vitals and nursing note reviewed.  Constitutional:      Appearance: Normal  appearance.  HENT:     Head: Atraumatic.  Eyes:     Pupils: Pupils are equal, round, and reactive to light.  Pulmonary:     Breath sounds: No wheezing or rhonchi.  Abdominal:     Tenderness: There is no abdominal tenderness.  Musculoskeletal:        General: No tenderness.  Skin:    Capillary Refill: Capillary refill takes less than 2 seconds.  Neurological:     Mental Status: He is alert.     Comments: Awake and answers questions however may have some mild confusion.     ED Results / Procedures / Treatments   Labs (all labs ordered are listed, but only abnormal results are displayed) Labs Reviewed  COMPREHENSIVE METABOLIC PANEL - Abnormal; Notable for the following components:      Result Value   CO2 21 (*)    BUN <5 (*)    Calcium 8.8 (*)    Total Protein 8.4 (*)    All other components within normal limits  ETHANOL - Abnormal; Notable for the following components:   Alcohol, Ethyl (B) 328 (*)    All other components within normal limits  CBC - Abnormal; Notable for the following components:   MCV 104.2 (*)    MCH 35.0 (*)  All other components within normal limits  RAPID URINE DRUG SCREEN, HOSP PERFORMED    EKG None  Radiology No results found.  Procedures Procedures    Medications Ordered in ED Medications  LORazepam (ATIVAN) injection 0-4 mg (has no administration in time range)    Or  LORazepam (ATIVAN) tablet 0-4 mg (has no administration in time range)  LORazepam (ATIVAN) injection 0-4 mg (has no administration in time range)    Or  LORazepam (ATIVAN) tablet 0-4 mg (has no administration in time range)  thiamine (VITAMIN B1) tablet 100 mg (has no administration in time range)    Or  thiamine (VITAMIN B1) injection 100 mg (has no administration in time range)  sodium chloride 0.9 % bolus 1,000 mL (0 mLs Intravenous Stopped 01/03/23 1336)    ED Course/ Medical Decision Making/ A&P                             Medical Decision Making Amount  and/or Complexity of Data Reviewed Labs: ordered.  Risk OTC drugs. Prescription drug management.   Also had reported needing alcohol detox.  History patient reportedly brought in for low blood pressure.  Potentially last day or 2.  Has been unsteady.  Recently seen after potential seizure.  No further seizures however.  Will check basic blood work.  Doubt intracranial hemorrhage.  Reviewed discharge note.  Lab work overall reassuring but does show alcohol level that is above 300.  Patient was not honest with his alcohol intake.  Mild orthostasis and was given fluid.  Feeling better.  However patient is here with his mother.  He lives with her and they have not been managing all that well with his alcohol use.  I think due to some social issues it is difficult for him and her to help try and arrange substance abuse follow-up and also has had potential seizure with alcohol withdrawal in the past.  Do not think he needs hospital admission but would benefit from more urgent follow-up for the alcohol use.  Will discuss with CTS to hopefully help be able to get more resources for him.        Final Clinical Impression(s) / ED Diagnoses Final diagnoses:  Alcohol abuse    Rx / DC Orders ED Discharge Orders     None         Benjiman Core, MD 01/03/23 1415

## 2023-01-03 NOTE — ED Triage Notes (Signed)
C/o needing assistance with ETOH detox.  Pt reports last drink 2 days ago.  Family reports patient has been unsteady on feet at home and having AMS.  Pt was seen on 4/20 and admitted and provided Substance abuse resources.

## 2023-01-03 NOTE — ED Notes (Signed)
Pt transported via WC to floor condition stable

## 2023-01-03 NOTE — ED Notes (Signed)
Pt transported via WC condition stable

## 2023-01-03 NOTE — ED Notes (Signed)
Security wanded pt ?

## 2023-01-03 NOTE — ED Notes (Signed)
Pt changed into burgundy scrubs. Pt has 1 belonging's bag that includes: jeans, belt, socks, shoes, button-up shirt, a pack of cigs, lighter, keys, wallet, coffee mug, and phone. Bag LABELED and placed in cabinet 16-18.

## 2023-01-03 NOTE — H&P (Signed)
History and Physical      Chad Golden ZOX:[id number] DOB: December 29, 1960 DOA: 01/03/2023; DOS: 01/03/2023  PCP: Chad Beers, FNP  Patient coming from: home   I have personally briefly reviewed patient's old medical records in [facility]  Chief Complaint:   HPI: Chad Golden is a 62 y.o. male with medical history significant for chronic alcohol abuse, alcohol withdrawal seizures, chronic tobacco abuse, who is admitted to [facility] on 01/03/2023 with desire for alcohol detoxification after presenting from home to [facility] ED requesting alcohol detoxification.   The patient has a documented history of chronic alcohol abuse for which she consumes greater than a sixpack of beer on a daily basis.  He was recently hospitalized from 12/22/2022 to 12/23/2022 for alcohol withdrawal complicated by alcohol withdrawal seizures.  Subsequent to discharge to home, he has started drinking again.  However, he presents back to [facility] emergency department this evening, conveying his desire to completely quit alcohol consumption, and requesting associated alcohol detoxification.  Denies any additional recreational drug use.  He states that his last alcohol was consumed yesterday, 01/02/2023.  Denies any recent chest pain, shortness of breath, palpitations, diaphoresis, dizziness, presyncope, or syncope.      ED Course:  Vital signs in the ED were notable for the following: Afebrile; heart rates in the 70s to 90s; systolic blood pressure in the low 100s to 130s; respiratory rate 16-18, oxygen saturation 99 to 100% on room air.  Labs were notable for the following: CMP notable for the following: Sodium 137, potassium 3.8, creatinine 0.69 compared to most recent prior value 0.65 on 01/01/2023, glucose 91, liver enzymes within normal limits.  Serum ethanol level 328.  Urinary drug screen was pan negative.  CBC notable for white blood cell count 7600.  EDP discussed patient's case with  psychiatry, who evaluated the patient, and subsequently recommended inpatient detoxification in the setting of the patient's history of alcohol withdrawal seizures.  They assisted with evaluating for availability for outpatient detoxification, but no spots were available at local clinics that perform the services.  While in the ED, the following were administered: Ativan 2 mg IV x 1 dose, thiamine 100 mg IV x 1 dose.  Subsequently, the patient was admitted for further evaluation and management of requested alcohol detoxification in the context of chronic alcohol abuse and acute alcohol intoxication.     Review of Systems: As per HPI otherwise 10 point review of systems negative.   Past Medical History:  Diagnosis Date   Alcohol abuse    Seizures (HCC)     History reviewed. No pertinent surgical history.  Social History:  reports that he has been smoking cigarettes. He has a 17.50 pack-year smoking history. He has never used smokeless tobacco. He reports current alcohol use. He reports that he does not use drugs.   Allergies  Allergen Reactions   Milk-Related Compounds Nausea And Vomiting    Family History  Problem Relation Age of Onset   Healthy Mother    Alcohol abuse Father     Family history reviewed and not pertinent    Prior to Admission medications   Medication Sig Start Date End Date Taking? Authorizing Provider  acetaminophen (TYLENOL) 325 MG tablet Take 650 mg by mouth every 6 (six) hours as needed for mild pain.    [provider]  folic acid (FOLVITE) 1 MG tablet Take 1 tablet (1 mg total) by mouth daily. 12/23/22 04/02/23  Joycelyn Das, MD  nicotine (NICODERM CQ - DOSED IN MG/24 HOURS) 21 mg/24hr patch Place 1 patch (21 mg total) onto the skin daily. 12/24/22   Pokhrel, Rebekah Chesterfield, MD  thiamine (VITAMIN B-1) 100 MG tablet Take 1 tablet (100 mg total) by mouth daily. 12/24/22 04/03/23  Joycelyn Das, MD     Objective    Physical Exam: Vitals:    01/03/23 1119 01/03/23 1213 01/03/23 1351 01/03/23 1426  BP: 105/84 117/80 107/74 107/74  Pulse: 85 72 90 90  Resp:   16   Temp:   98.4 F (36.9 C)   TempSrc:   Oral   SpO2: 100%  100%   Weight:        General: appears to be stated age; alert, oriented Skin: warm, dry, no rash Head:  AT/ Mouth:  Oral mucosa membranes appear dry, normal dentition Neck: supple; trachea midline Heart:  RRR; did not appreciate any M/R/G Lungs: CTAB, did not appreciate any wheezes, rales, or rhonchi Abdomen: + BS; soft, ND, NT Vascular: 2+ pedal pulses b/l; 2+ radial pulses b/l Extremities: no peripheral edema, no muscle wasting Neuro: strength and sensation intact in upper and lower extremities b/l    Labs on Admission: I have personally reviewed following labs and imaging studies  CBC: Recent Labs  Lab 01/03/23 1120  WBC 7.6  HGB 15.0  HCT 44.7  MCV 104.2*  PLT 237   Basic Metabolic Panel: Recent Labs  Lab 01/01/23 1331 01/03/23 1120  NA 140 137  K 4.2 3.8  CL 106 103  CO2 17* 21*  GLUCOSE 98 91  BUN 4* <5*  CREATININE 0.65* 0.69  CALCIUM 8.5* 8.8*   GFR: Estimated Creatinine Clearance: 74.9 mL/min (by C-G formula based on SCr of 0.69 mg/dL). Liver Function Tests: Recent Labs  Lab 01/01/23 1331 01/03/23 1120  AST 28 31  ALT 20 20  ALKPHOS 60 52  BILITOT <0.2 0.6  PROT 6.8 8.4*  ALBUMIN 3.8* 4.0   No results for input(s): "LIPASE", "AMYLASE" in the last 168 hours. No results for input(s): "AMMONIA" in the last 168 hours. Coagulation Profile: No results for input(s): "INR", "PROTIME" in the last 168 hours. Cardiac Enzymes: No results for input(s): "CKTOTAL", "CKMB", "CKMBINDEX", "TROPONINI" in the last 168 hours. BNP (last 3 results) No results for input(s): "PROBNP" in the last 8760 hours. HbA1C: No results for input(s): "HGBA1C" in the last 72 hours. CBG: No results for input(s): "GLUCAP" in the last 168 hours. Lipid Profile: No results for input(s):  "CHOL", "HDL", "LDLCALC", "TRIG", "CHOLHDL", "LDLDIRECT" in the last 72 hours. Thyroid Function Tests: No results for input(s): "TSH", "T4TOTAL", "FREET4", "T3FREE", "THYROIDAB" in the last 72 hours. Anemia Panel: No results for input(s): "VITAMINB12", "FOLATE", "FERRITIN", "TIBC", "IRON", "RETICCTPCT" in the last 72 hours. Urine analysis:    Component Value Date/Time   COLORURINE YELLOW 12/22/2022 1311   APPEARANCEUR CLEAR 12/22/2022 1311   LABSPEC 1.016 12/22/2022 1311   PHURINE 5.0 12/22/2022 1311   GLUCOSEU NEGATIVE 12/22/2022 1311   HGBUR MODERATE (A) 12/22/2022 1311   BILIRUBINUR NEGATIVE 12/22/2022 1311   KETONESUR 20 (A) 12/22/2022 1311   PROTEINUR 100 (A) 12/22/2022 1311   UROBILINOGEN 1.0 12/31/2014 2005   NITRITE NEGATIVE 12/22/2022 1311   LEUKOCYTESUR NEGATIVE 12/22/2022 1311    Radiological Exams on Admission: No results found.    Assessment/Plan    Principal Problem:   Alcohol abuse Active Problems:   Alcohol withdrawal (HCC)   Acute alcohol intoxication (HCC)   Tobacco abuse     #)  Chronic Alcohol Abuse: the patient reports typical daily alcohol consumption of greater than 1 sixpack of beer per day, and that typical daily alcohol consumption of this volume has been occurring for multiple years.  However, the kidneys which for alcohol detoxification, specifically giving the milligram once to consume alcohol.  Psychiatry was consulted, and recommended inpatient alcohol detoxification in the context of a history of alcohol withdrawal seizures, as further detailed above.    The patient conveys that most recent alcohol was consumed yesterday, 01/02/2023, although it appears that he may have consumed alcohol more recently than this, given the degree of elevation in his serum ethanol level to 328, which would likely delay onset of ensuing development of alcohol withdrawal symptoms.   At this time, no overt evidence of active alcohol withdrawal.  Patient is alert and  oriented, in no acute distress.  Vital signs appear stable, with normotensive blood pressure and nontachycardic heart rates.  Close monitoring for development of evidence of alcohol withdrawal, including close attention to trend in vital signs, as well as close monitoring of electrolytes, as described below.  Status post administration of IV thiamine in the ED today, as above.  Plan: counseled the patient on the importance of reduction in alcohol consumption. Consult to transition of care team placed. Close monitoring of ensuing BP and HR via routine VS. Symptoms-based CIWA protocol with prn Ativan ordered. Seizure precautions. Telemetry. Add-on serum Mg level. Check serum phosphorus level. Repeat CMP in the morning. Check INR.  Daily thiamine, as well as do daily folic acid and multivitamin supplementation.       Marland Kitchen     #) Acute alcohol intoxication: Patient appears acutely intoxicated at the time of today's presentation, commiserated by presenting serum ethanol level of 328.  This is in the context of his documented history of chronic alcohol abuse for which she is seeking detox, as further discussed above.  Urinary drug screen has been negative.  He is status post dose of IV thiamine today.  Plan: CIWA protocol, as above.  Continue daily IV thiamine.  Daily folic acid multivitamin supplementation.  Transition of care consultation placed.  Continuous lactated Ringer's, as above.  Serum magnesium and phosphorus levels in the morning.  CMP in the morning.              #) Chronic tobacco abuse: Patient conveys that they are a current smoker, having smoked 0.5 ppd for over 30 years.   Plan: Counseled the patient for less than 2 minutes on the importance of complete smoking discontinuation.  Order placed for prn nicotine patch for use during this hospitalization.       DVT prophylaxis: SCD's   Code Status: Full code Family Communication: none  Disposition Plan: Per Rounding  Team Consults called: EDP discussed patient's case with on-call psychiatry, as further detailed above; Admission status: Inpatient    I SPENT GREATER THAN 75  MINUTES IN CLINICAL CARE TIME/MEDICAL DECISION-MAKING IN COMPLETING THIS ADMISSION.     Chaney Born Keenen Roessner DO [facility] From 7PM - 7AM   01/03/2023, 7:41 PM

## 2023-01-03 NOTE — ED Notes (Signed)
Pt's mother wanded and belongings placed in locker. Visiting w/pt

## 2023-01-03 NOTE — Progress Notes (Signed)
At 9:56pm this CSW communicated with Care Team: Night [facility] Ervin Knack) [place], RN, Night [facility] Fransico Michael, RN, Sindy Guadel[place], NP, and Alona Bene, PMHNP requesting that pt be reviewed for [facility]. Psych Provider Alona Bene, PMHNP updated that pt has been medically admitted for ETOH withdrawals. This CSW will leave pt on the [facility] shift report and continue to follow at this time.   Maryjean Ka, MSW, [facility] 01/03/2023 11:36 PM

## 2023-01-03 NOTE — ED Notes (Signed)
Pt given sandwich and soda.  

## 2023-01-03 NOTE — Consult Note (Addendum)
[facility] ED ASSESSMENT   Reason for Consult:  help with Alcohol tx Referring Physician:  Dr. Rubin Payor  Patient Identification: Chad Golden MRN:  [id number] ED Chief Complaint: Alcohol abuse  Diagnosis:  Principal Problem:   Alcohol abuse   ED Assessment Time Calculation: Start Time: 1550 Stop Time: 1630 Total Time in Minutes (Assessment Completion): 40    HPI: Per Triage Note: C/o needing assistance with ETOH detox.  Pt reports last drink 2 days ago. Family reports patient has been unsteady on feet at home and having AMS. Pt was seen on 4/20 and admitted and provided Substance abuse resources.    Subjective:  Chad Golden, 62 y.o., male patient seen face to face by this provider, consulted with Dr. Lucianne Muss; and chart reviewed on 01/03/23.  On evaluation Chad Golden reports that he is doing okay, states that he is here due to his alcohol use.  Patient states that he wants to get help with alcohol detox, states that he lives with his mother and feels like he cannot help her with her medical issues because he needs help.  Patient denies SI/HI/AVH patient states his appetite is fair and he sleeps about 5 hours a night, he says which is good for him.  When asked about how much does he drink daily, and patient states he only drinks 1-40 ounce of beer daily, when informed him that his BAL was 328 on admission, he states "that cannot be true, I do not drink that much."Patient states that he has been drinking since the age of 76, says he drank from the ages of 70 to 89 years old, started drinking at 59 to 44 years ago, then started drinking again for 13 to 62 years old, then stopped for about a couple years, and recently started back.  Patient stated that he has tried his whole life to stop drinking but he had a hard time due to stress, and anxiety.  Patient states that he has been admitted to [facility] about 3 years ago for alcohol detox, and he was in [place], but has not been to a detox  facility since.  Patient UDS negative for illicit substances, patient does not endorse any using in any substances.  Patient states he does not have any past psychiatric history, currently not on any medications.   During evaluation Chad Golden is laying in bed, watching TV in no acute distress.  He is mildly drowsy, but easy to arouse, oriented x3, calm, cooperative and attentive.  His mood is euthymic with congruent affect.  He has normal speech, and behavior.  Objectively there is no evidence of psychosis/mania or delusional thinking.  Patient is able to converse coherently, thoughts, no distractibility, or pre-occupation.  He denies suicidal/self-harm/homicidal ideation, psychosis, and paranoia.   Patient is a 62 year old male with a past history of alcohol abuse and history of seizures, unsure the patient had a seizure on 12/22/22, when admitted for altered mental status, but before then patient's last seizure was 15 years ago.  Seizures are related to alcohol withdrawal, patient not on any antiepileptics.  Patient did not have any seizure activity while admitted 12/22/22-12/23/22.    Past Psychiatric History: Alcohol abuse  Risk to Self: No Risk to Others:  No  Prior Inpatient Therapy: No  Prior Outpatient Therapy:  No    [place] Scale:  Flowsheet Row ED from 12/22/2022 in [facility] Emergency Department at [facility] Most recent reading at 12/22/2022  1:02 PM ED  from 03/08/2022 in [facility] Emergency Department at [facility] Most recent reading at 03/08/2022  1:09 PM ED from 03/08/2022 in [facility] at [facility] Most recent reading at 03/08/2022 12:26 PM  C-SSRS RISK CATEGORY No Risk No Risk No Risk       AIMS:  , , ,  ,   ASAM:    Substance Abuse:     Past Medical History:  Past Medical History:  Diagnosis Date   Alcohol abuse    Seizures (HCC)    None in the last 15 years   History reviewed. No pertinent surgical history. Family History:   Family History  Problem Relation Age of Onset   Healthy Mother    Alcohol abuse Father     Social History:  Social History   Substance and Sexual Activity  Alcohol Use Yes   Comment: Drinks couple beers after work daily     Social History   Substance and Sexual Activity  Drug Use No    Social History   Socioeconomic History   Marital status: Single    Spouse name: Not on file   Number of children: 0   Years of education: 12   Highest education level: Not on file  Occupational History   Occupation: Consulting civil engineer    Comment: Clinical biochemist  Tobacco Use   Smoking status: Every Day    Packs/day: 0.50    Years: 35.00    Additional pack years: 0.00    Total pack years: 17.50    Types: Cigarettes   Smokeless tobacco: Never  Substance and Sexual Activity   Alcohol use: Yes    Comment: Drinks couple beers after work daily   Drug use: No   Sexual activity: Not on file  Other Topics Concern   Not on file  Social History Narrative   Not on file   Social Determinants of Health   Financial Resource Strain: Not on file  Food Insecurity: No Food Insecurity (12/22/2022)   Hunger Vital Sign    Worried About Running Out of Food in the Last Year: Never true    Ran Out of Food in the Last Year: Never true  Transportation Needs: No Transportation Needs (12/22/2022)   PRAPARE - Administrator, Civil Service (Medical): No    Lack of Transportation (Non-Medical): No  Physical Activity: Not on file  Stress: Not on file  Social Connections: Not on file      Allergies:   Allergies  Allergen Reactions   Milk-Related Compounds Nausea And Vomiting    Labs:  Results for orders placed or performed during the hospital encounter of 01/03/23 (from the past 48 hour(s))  Rapid urine drug screen (hospital performed)     Status: None   Collection Time: 01/03/23 11:17 AM  Result Value Ref Range   Opiates NONE DETECTED NONE DETECTED   Cocaine NONE DETECTED NONE DETECTED    Benzodiazepines NONE DETECTED NONE DETECTED   Amphetamines NONE DETECTED NONE DETECTED   Tetrahydrocannabinol NONE DETECTED NONE DETECTED   Barbiturates NONE DETECTED NONE DETECTED    Comment: (NOTE) DRUG SCREEN FOR MEDICAL PURPOSES ONLY.  IF CONFIRMATION IS NEEDED FOR ANY PURPOSE, NOTIFY LAB WITHIN 5 DAYS.  LOWEST DETECTABLE LIMITS FOR URINE DRUG SCREEN Drug Class                     Cutoff (ng/mL) Amphetamine and metabolites    1000 Barbiturate and metabolites    200 Benzodiazepine  200 Opiates and metabolites        300 Cocaine and metabolites        300 THC                            50 Performed at [facility], [address], [place], [place] [address]   Comprehensive metabolic panel     Status: Abnormal   Collection Time: 01/03/23 11:20 AM  Result Value Ref Range   Sodium 137 135 - 145 mmol/L   Potassium 3.8 3.5 - 5.1 mmol/L   Chloride 103 98 - 111 mmol/L   CO2 21 (L) 22 - 32 mmol/L   Glucose, Bld 91 70 - 99 mg/dL    Comment: Glucose reference range applies only to samples taken after fasting for at least 8 hours.   BUN <5 (L) 8 - 23 mg/dL   Creatinine, Ser 2.95 0.61 - 1.24 mg/dL   Calcium 8.8 (L) 8.9 - 10.3 mg/dL   Total Protein 8.4 (H) 6.5 - 8.1 g/dL   Albumin 4.0 3.5 - 5.0 g/dL   AST 31 15 - 41 U/L   ALT 20 0 - 44 U/L   Alkaline Phosphatase 52 38 - 126 U/L   Total Bilirubin 0.6 0.3 - 1.2 mg/dL   GFR, Estimated >62 >13 mL/min    Comment: (NOTE) Calculated using the CKD-EPI Creatinine Equation (2021)    Anion gap 13 5 - 15    Comment: Performed at [facility], [address] [place], [place] [address]  Ethanol     Status: Abnormal   Collection Time: 01/03/23 11:20 AM  Result Value Ref Range   Alcohol, Ethyl (B) 328 (HH) <10 mg/dL    Comment: CRITICAL RESULT CALLED TO, READ BACK BY AND VERIFIED WITH BRUNSON, T. RN AT 1217 BY ON 01/03/2023 BY MECIAL J. (NOTE) Lowest detectable limit for serum alcohol  is 10 mg/dL.  For medical purposes only. Performed at [facility], [address] [place], [place] [address]   cbc     Status: Abnormal   Collection Time: 01/03/23 11:20 AM  Result Value Ref Range   WBC 7.6 4.0 - 10.5 K/uL   RBC 4.29 4.22 - 5.81 MIL/uL   Hemoglobin 15.0 13.0 - 17.0 g/dL   HCT 29.5 28.4 - 13.2 %   MCV 104.2 (H) 80.0 - 100.0 fL   MCH 35.0 (H) 26.0 - 34.0 pg   MCHC 33.6 30.0 - 36.0 g/dL   RDW 44.0 10.2 - 72.5 %   Platelets 237 150 - 400 K/uL   nRBC 0.0 0.0 - 0.2 %    Comment: Performed at [facility], [address], [place], [place] [address]    Current Facility-Administered Medications  Medication Dose Route Frequency Provider Last Rate Last Admin   LORazepam (ATIVAN) injection 0-4 mg  0-4 mg Intravenous Donaciano Eva, MD   2 mg at 01/03/23 1431   Or   LORazepam (ATIVAN) tablet 0-4 mg  0-4 mg Oral Q6H Benjiman Core, MD       Melene Muller ON 01/05/2023] LORazepam (ATIVAN) injection 0-4 mg  0-4 mg Intravenous Orma Render, MD       Or   Melene Muller ON 01/05/2023] LORazepam (ATIVAN) tablet 0-4 mg  0-4 mg Oral Q12H Benjiman Core, MD       thiamine (VITAMIN B1) tablet 100 mg  100 mg Oral Daily Benjiman Core, MD  Or   thiamine (VITAMIN B1) injection 100 mg  100 mg Intravenous Daily Benjiman Core, MD   100 mg at 01/03/23 1432   Current Outpatient Medications  Medication Sig Dispense Refill   acetaminophen (TYLENOL) 325 MG tablet Take 650 mg by mouth every 6 (six) hours as needed for mild pain.     folic acid (FOLVITE) 1 MG tablet Take 1 tablet (1 mg total) by mouth daily. 100 tablet 0   nicotine (NICODERM CQ - DOSED IN MG/24 HOURS) 21 mg/24hr patch Place 1 patch (21 mg total) onto the skin daily. 28 patch 0   thiamine (VITAMIN B-1) 100 MG tablet Take 1 tablet (100 mg total) by mouth daily. 100 tablet 0    Musculoskeletal: Strength & Muscle Tone: within normal limits Gait & Station: normal Patient  leans: N/A   Psychiatric Specialty Exam: Presentation  General Appearance:  Appropriate for Environment  Eye Contact: Fleeting  Speech: Clear and Coherent  Speech Volume: Normal  Handedness:No data recorded  Mood and Affect  Mood: Anxious; Euthymic  Affect: Appropriate; Flat   Thought Process  Thought Processes: Coherent  Descriptions of Associations:Intact  Orientation:Full (Time, Place and Person)  Thought Content:WDL  History of Schizophrenia/Schizoaffective disorder:No data recorded Duration of Psychotic Symptoms:No data recorded Hallucinations:Hallucinations: None  Ideas of Reference:None  Suicidal Thoughts:Suicidal Thoughts: No  Homicidal Thoughts:Homicidal Thoughts: No   Sensorium  Memory: Immediate Good; Recent Fair; Remote Fair  Judgment: Impaired  Insight: Poor   Executive Functions  Concentration: Fair  Attention Span: Fair  Recall: Good  Fund of Knowledge: Good  Language: Good   Psychomotor Activity  Psychomotor Activity: Psychomotor Activity: Normal   Assets  Assets: Communication Skills; Housing; Social Support    Sleep  Sleep: Sleep: Fair   Physical Exam: Physical Exam Vitals and nursing note reviewed. Exam conducted with a chaperone present.  Neurological:     Mental Status: He is alert.  Psychiatric:        Attention and Perception: Attention normal.        Mood and Affect: Mood normal.        Speech: Speech normal.        Behavior: Behavior is cooperative.        Thought Content: Thought content normal.        Cognition and Memory: Memory normal.        Judgment: Judgment is impulsive.    Review of Systems  Constitutional: Negative.   HENT: Negative.    Psychiatric/Behavioral:  Positive for substance abuse.    Blood pressure 107/74, pulse 90, temperature 98.4 F (36.9 C), temperature source Oral, resp. rate 16, weight 56 kg, SpO2 100 %. Body mass index is 19.93 kg/m.   Medical  Decision Making: Patient case review and discussed with Dr. Lucianne Muss.  Patient recommended for alcohol inpatient detox treatment for stabilization and treatment. Will consult with [facility] to see if patient meets criteria. Patient started on Ativan Protocol and CIWA.     Alona Bene, PMHNP 01/03/2023 4:31 PM

## 2023-01-04 DIAGNOSIS — F1010 Alcohol abuse, uncomplicated: Secondary | ICD-10-CM | POA: Diagnosis not present

## 2023-01-04 LAB — PROTIME-INR
INR: 1 (ref 0.8–1.2)
Prothrombin Time: 13.3 seconds (ref 11.4–15.2)

## 2023-01-04 LAB — COMPREHENSIVE METABOLIC PANEL
ALT: 16 U/L (ref 0–44)
AST: 26 U/L (ref 15–41)
Albumin: 3.2 g/dL — ABNORMAL LOW (ref 3.5–5.0)
Alkaline Phosphatase: 44 U/L (ref 38–126)
Anion gap: 10 (ref 5–15)
BUN: 11 mg/dL (ref 8–23)
CO2: 22 mmol/L (ref 22–32)
Calcium: 8.6 mg/dL — ABNORMAL LOW (ref 8.9–10.3)
Chloride: 100 mmol/L (ref 98–111)
Creatinine, Ser: 0.69 mg/dL (ref 0.61–1.24)
GFR, Estimated: >60 mL/min (ref >60)
Glucose, Bld: 94 mg/dL (ref 70–99)
Potassium: 4.3 mmol/L (ref 3.5–5.1)
Sodium: 132 mmol/L — ABNORMAL LOW (ref 135–145)
Total Bilirubin: 0.8 mg/dL (ref 0.3–1.2)
Total Protein: 6.8 g/dL (ref 6.5–8.1)

## 2023-01-04 LAB — CBC WITH DIFFERENTIAL/PLATELET
Abs Immature Granulocytes: 0.03 10*3/uL (ref 0.00–0.07)
Basophils Absolute: 0.1 10*3/uL (ref 0.0–0.1)
Basophils Relative: 1 %
Eosinophils Absolute: 0 10*3/uL (ref 0.0–0.5)
Eosinophils Relative: 0 %
HCT: 38.5 % — ABNORMAL LOW (ref 39.0–52.0)
Hemoglobin: 13.4 g/dL (ref 13.0–17.0)
Immature Granulocytes: 0 %
Lymphocytes Relative: 23 %
Lymphs Abs: 1.9 10*3/uL (ref 0.7–4.0)
MCH: 35.6 pg — ABNORMAL HIGH (ref 26.0–34.0)
MCHC: 34.8 g/dL (ref 30.0–36.0)
MCV: 102.4 fL — ABNORMAL HIGH (ref 80.0–100.0)
Monocytes Absolute: 0.4 10*3/uL (ref 0.1–1.0)
Monocytes Relative: 5 %
Neutro Abs: 5.7 10*3/uL (ref 1.7–7.7)
Neutrophils Relative %: 71 %
Platelets: 203 10*3/uL (ref 150–400)
RBC: 3.76 MIL/uL — ABNORMAL LOW (ref 4.22–5.81)
RDW: 14.4 % (ref 11.5–15.5)
WBC: 8.1 10*3/uL (ref 4.0–10.5)
nRBC: 0 % (ref 0.0–0.2)

## 2023-01-04 LAB — PHOSPHORUS: Phosphorus: 3.3 mg/dL (ref 2.5–4.6)

## 2023-01-04 LAB — MAGNESIUM: Magnesium: 2 mg/dL (ref 1.7–2.4)

## 2023-01-04 NOTE — TOC Initial Note (Signed)
Transition of Care [facility]) - Initial/Assessment Note    Patient Details  Name: Chad Golden MRN: [id number] Date of Birth: 04-14-1961  Transition of Care [facility]) CM/SW Contact:    Howell Rucks, RN Phone Number: 01/04/2023, 12:22 PM  Clinical Narrative:  TOC referral received or Substance Abuse Resources (ETOH). Met with pt in room, introduced role of TOC/NCM and to review dc plans/SA resources, Pt agreeable to OP and IP SA resources. SA resources added to Discharge Instructions. Will continue to follow.                Expected Discharge Plan: Home/Self Care Barriers to Discharge: Continued Medical Work up   Patient Goals and CMS Choice Patient states their goals for this hospitalization and ongoing recovery are:: Home CMS Medicare.gov Compare Post Acute Care list provided to:: Patient Choice offered to / list presented to : Patient      Expected Discharge Plan and Services   Discharge Planning Services: CM Consult                                          Prior Living Arrangements/Services   Lives with:: Parents Patient language and need for interpreter reviewed:: Yes Do you feel safe going back to the place where you live?: Yes      Need for Family Participation in Patient Care: Yes (Comment) Care giver support system in place?: Yes (comment)   Criminal Activity/Legal Involvement Pertinent to Current Situation/Hospitalization: No - Comment as needed  Activities of Daily Living Home Assistive Devices/Equipment: None ADL Screening (condition at time of admission) Patient's cognitive ability adequate to safely complete daily activities?: Yes Is the patient deaf or have difficulty hearing?: No Does the patient have difficulty seeing, even when wearing glasses/contacts?: No Does the patient have difficulty concentrating, remembering, or making decisions?: No Patient able to express need for assistance with ADLs?: Yes Does the patient have difficulty dressing  or bathing?: No Independently performs ADLs?: Yes (appropriate for developmental age) Weakness of Legs: None Weakness of Arms/Hands: None  Permission Sought/Granted Permission sought to share information with : Case Manager Permission granted to share information with : Yes, Verbal Permission Granted  Share Information with NAME: Fannie Knee, RN           Emotional Assessment Appearance:: Appears stated age Attitude/Demeanor/Rapport: Gracious Affect (typically observed): Accepting Orientation: : Oriented to Self, Oriented to Place, Oriented to Situation, Oriented to  Time Alcohol / Substance Use: Alcohol Use, Tobacco Use Psych Involvement: Yes (comment)  Admission diagnosis:  Alcohol withdrawal (HCC) [F10.939] Alcohol abuse [F10.10] Patient Active Problem List   Diagnosis Date Noted   Alcohol withdrawal (HCC) 01/03/2023   Acute alcohol intoxication (HCC) 01/03/2023   Tobacco abuse 01/03/2023   Hypotension 12/31/2022   Hyponatremia 12/31/2022   Encounter for smoking cessation counseling 12/31/2022   Depression 12/31/2022   Encounter for examination following treatment at hospital 12/31/2022   Altered mental status 12/22/2022   Alcohol abuse 12/22/2022   Seizures (HCC) 12/22/2022   Degenerative disc disease, cervical 12/07/2016   PCP:  Donell Beers, FNP Pharmacy:   [facility] - [place]), [place] - [address] [place] ([place]) [place] [address] Phone: [phone] Fax: [phone]     Social Determinants of Health (SDOH) Social History: SDOH Screenings   Food Insecurity: Food Insecurity Present (01/03/2023)  Housing: Low Risk  (  12/22/2022)  Transportation Needs: No Transportation Needs (01/03/2023)  Utilities: Not At Risk (01/03/2023)  Depression (PHQ2-9): High Risk (12/31/2022)  Tobacco Use: High Risk (01/03/2023)   SDOH Interventions:     Readmission Risk Interventions    01/04/2023   12:20 PM  Readmission Risk  Prevention Plan  Post Dischage Appt Complete  Medication Screening Complete  Transportation Screening Complete

## 2023-01-04 NOTE — Progress Notes (Signed)
PROGRESS NOTE  Chad Golden  ZOX:[id number] DOB: 1961/01/22 DOA: 01/03/2023 PCP: Donell Beers, FNP   Brief Narrative: Patient is a 62 year old male with history of chronic alcohol abuse, alcohol withdrawal seizures, chronic tobacco abuse who was admitted for alcohol detoxification.  He was recently hospitalized last month for alcohol withdrawal complicated by alcohol withdrawal seizures.  He was discharged home but he started drinking again.  He presented back to the emergency department requesting for alcohol detoxification.  On presentation ,he was hemodynamically stable.  Alcohol level was 328.  ED physician discussed with psychiatry who evaluated the patient and recommended inpatient detoxification in the setting of patient's history of alcohol withdrawal seizures.  Assessment & Plan:  Principal Problem:   Alcohol abuse Active Problems:   Alcohol withdrawal (HCC)   Acute alcohol intoxication (HCC)   Tobacco abuse  Chronic alcohol abuse: Consumes more than a sixpack of beer a day for last several years. Recently admitted for the same.  Patient requesting alcohol detoxification.  Has high risk  for alcohol withdrawal.  Psychiatry also saw the patient.  Currently on CIWA protocol. Monitor for alcohol withdrawal seizures. Currently on thiamine and folic acid. This morning he remains alert and oriented.  Does not appear in withdrawal. We requested TOC to provide alcohol rehabilitation resources  Chronic tobacco use: Counseled cessation.  Smokes half packs a day for last 30 years  Hyponatremia: Mild.  Most likely secondary to beer potomania.  Continue to monitor         DVT prophylaxis:SCDs Start: 01/03/23 1936     Code Status: Full Code  Family Communication: None at bedside  Patient status:Inpatient  Patient is from :home  Anticipated discharge WJ:XBJY  Estimated DC date:tomorrow   Consultants: Psychiatry  Procedures:None  Antimicrobials:   Anti-infectives (From admission, onward)    None       Subjective: Patient seen and examined at bedside today.  Hemodynamically stable.  Comfortable.  Alert ,oriented.  Does not appear to have significant withdrawal symptoms.  No tremors.  Requesting for alcohol rehabilitation resources  Objective: Vitals:   01/03/23 1941 01/03/23 2224 01/04/23 0217 01/04/23 0602  BP: 135/81 139/82 (!) 152/91 128/79  Pulse: 98 85 98 80  Resp: 18 16 18 18   Temp: 98.4 F (36.9 C) 98.3 F (36.8 C) 98.5 F (36.9 C) 98 F (36.7 C)  TempSrc: Oral Oral Oral Oral  SpO2: 100% 100% 99% 100%  Weight:        Intake/Output Summary (Last 24 hours) at 01/04/2023 7829 Last data filed at 01/04/2023 0604 Gross per 24 hour  Intake 982.35 ml  Output 225 ml  Net 757.35 ml   Filed Weights   01/03/23 1049  Weight: 56 kg    Examination:  General exam: Overall comfortable, not in distress HEENT: PERRL Respiratory system:  no wheezes or crackles  Cardiovascular system: S1 & S2 heard, RRR.  Gastrointestinal system: Abdomen is nondistended, soft and nontender. Central nervous system: Alert and oriented Extremities: No edema, no clubbing ,no cyanosis Skin: No rashes, no ulcers,no icterus     Data Reviewed: I have personally reviewed following labs and imaging studies  CBC: Recent Labs  Lab 01/03/23 1120 01/04/23 0557  WBC 7.6 8.1  NEUTROABS  --  5.7  HGB 15.0 13.4  HCT 44.7 38.5*  MCV 104.2* 102.4*  PLT 237 203   Basic Metabolic Panel: Recent Labs  Lab 01/01/23 1331 01/03/23 1120 01/03/23 2140 01/04/23 0557  NA 140 137  --  132*  K 4.2 3.8  --  4.3  CL 106 103  --  100  CO2 17* 21*  --  22  GLUCOSE 98 91  --  94  BUN 4* <5*  --  11  CREATININE 0.65* 0.69  --  0.69  CALCIUM 8.5* 8.8*  --  8.6*  MG  --   --  2.1 2.0  PHOS  --   --   --  3.3     No results found for this or any previous visit (from the past 240 hour(s)).   Radiology Studies: No results found.  Scheduled Meds:   folic acid  1 mg Oral Daily   LORazepam  0-4 mg Intravenous Q6H   Or   LORazepam  0-4 mg Oral Q6H   [START ON 01/05/2023] LORazepam  0-4 mg Intravenous Q12H   Or   [START ON 01/05/2023] LORazepam  0-4 mg Oral Q12H   multivitamin  1 tablet Oral Daily   thiamine  100 mg Oral Daily   Or   thiamine  100 mg Intravenous Daily   Continuous Infusions:  lactated ringers 100 mL/hr at 01/04/23 0115     LOS: 1 day   Burnadette Pop, MD [facility] P5/11/2022, 8:08 AM

## 2023-01-05 DIAGNOSIS — F1010 Alcohol abuse, uncomplicated: Secondary | ICD-10-CM | POA: Diagnosis not present

## 2023-01-05 MED ORDER — GABAPENTIN 300 MG PO CAPS: 300.0000 mg | ORAL_CAPSULE | Freq: Every day | ORAL | Status: DC

## 2023-01-05 MED ORDER — FOLIC ACID 1 MG PO TABS: 1.0000 mg | ORAL_TABLET | Freq: Every day | ORAL | 0 refills | Status: AC

## 2023-01-05 MED ORDER — ACETAMINOPHEN 500 MG PO TABS: 500.0000 mg | ORAL_TABLET | Freq: Four times a day (QID) | ORAL | 0 refills | Status: AC | PRN

## 2023-01-05 MED ORDER — GABAPENTIN 300 MG PO CAPS: 300.0000 mg | ORAL_CAPSULE | Freq: Every day | ORAL | 0 refills | Status: DC

## 2023-01-05 MED ORDER — NICOTINE 14 MG/24HR TD PT24: 14.0000 mg | MEDICATED_PATCH | Freq: Every day | TRANSDERMAL | 0 refills | Status: DC | PRN

## 2023-01-05 MED ORDER — VITAMIN B-1 100 MG PO TABS: 100.0000 mg | ORAL_TABLET | Freq: Every day | ORAL | 0 refills | Status: AC

## 2023-01-05 NOTE — Discharge Summary (Addendum)
Physician Discharge Summary  Chad Golden VHQ:[id number] DOB: December 24, 1960 DOA: 01/03/2023  PCP: Donell Beers, FNP  Admit date: 01/03/2023 Discharge date: 01/05/2023  Admitted From: Home Disposition:  Home  Discharge Condition:Stable CODE STATUS:FULL Diet recommendation: Regular   Brief/Interim Summary: Patient is a 62 year old male with history of chronic alcohol abuse, alcohol withdrawal seizures, chronic tobacco abuse who was admitted for alcohol detoxification.  He was recently hospitalized last month for alcohol withdrawal complicated by alcohol withdrawal seizures.  He was discharged home but he started drinking again.  He presented back to the emergency department requesting for alcohol detoxification.  On presentation ,he was hemodynamically stable.  Alcohol level was 328.  ED physician discussed with psychiatry who evaluated the patient and recommended inpatient detoxification in the setting of patient's history of alcohol withdrawal seizures .  Patient's hospital course remained stable.  Currently he is not in withdrawal.  Currently alert and oriented.  Medically stable for discharge home today.  Social worker met him and provided alcohol rehab resources  Following problems were addressed during the hospitalization:  Chronic alcohol abuse: Consumes more than a sixpack of beer a day for last several years. Recently admitted for the same.  Patient requesting alcohol detoxification.  Psychiatry also saw the patient.  Started on CIWA protocol. Currently on thiamine and folic acid. This morning he remains alert and oriented.  Does not appear in withdrawal. We requested TOC to provide alcohol rehabilitation resources. Psychiatry also following and recommended gabapentin for alcohol craving.   Chronic tobacco use: Counseled cessation.  Smokes half packs a day for last 30 years   Hyponatremia: Mild.  Most likely secondary to beer potomania.  Continue to monitor as outpatient  ,stable  Right arm pain: No swelling or tenderness.  Continue Tylenol as needed     Discharge Diagnoses:  Principal Problem:   Alcohol abuse Active Problems:   Alcohol withdrawal (HCC)   Acute alcohol intoxication (HCC)   Tobacco abuse    Discharge Instructions  Discharge Instructions     Diet general   Complete by: As directed    Discharge instructions   Complete by: As directed    1)Quit alcohol 2)Take prescribed medications as instructed 3)Follow up with your PCP in a week   Increase activity slowly   Complete by: As directed       Allergies as of 01/05/2023       Reactions   Milk-related Compounds Nausea And Vomiting        Medication List     STOP taking these medications    nicotine 21 mg/24hr patch Commonly known as: NICODERM CQ - dosed in mg/24 hours Replaced by: nicotine 14 mg/24hr patch       TAKE these medications    acetaminophen 500 MG tablet Commonly known as: TYLENOL Take 1 tablet (500 mg total) by mouth every 6 (six) hours as needed. What changed:  medication strength how much to take reasons to take this   CLEAR EYES COMPLETE OP Apply 1 drop to eye as needed (Dry eye).   folic acid 1 MG tablet Commonly known as: FOLVITE Take 1 tablet (1 mg total) by mouth daily.   gabapentin 300 MG capsule Commonly known as: NEURONTIN Take 1 capsule (300 mg total) by mouth at bedtime. If you continue to have alcohol craving after a week ,start taking twice a day(one in AM and one in PM)   nicotine 14 mg/24hr patch Commonly known as: NICODERM CQ - dosed in mg/24 hours  Place 1 patch (14 mg total) onto the skin daily as needed (smoking cessation). Replaces: nicotine 21 mg/24hr patch   thiamine 100 MG tablet Commonly known as: Vitamin B-1 Take 1 tablet (100 mg total) by mouth daily.        Follow-up Information     Donell Beers, FNP. Schedule an appointment as soon as possible for a visit in 1 week(s).   Specialty: Nurse  Practitioner Contact information: [address] [place], [place] [address] [phone]                Allergies  Allergen Reactions   Milk-Related Compounds Nausea And Vomiting    Consultations: Psychiatry   Procedures/Studies: EEG adult  Result Date: December 28, 2022 Rejeana Brock, MD     12-28-22  9:09 PM History: 62 yo M with a history of seizures presenting with AMS Sedation: none Technique: This EEG was acquired with electrodes placed according to the International 10-20 electrode system (including Fp1, Fp2, F3, F4, C3, C4, P3, P4, O1, O2, T3, T4, T5, T6, A1, A2, Fz, Cz, Pz). The following electrodes were missing or displaced: none. Background: The background consists of intermixed alpha and beta activities. There is a well defined posterior dominant rhythm of 9-10 Hz that attenuates with eye opening. Sleep is not recorded . Photic stimulation: Physiologic driving is not performed EEG Abnormalities: none Clinical Interpretation: This normal EEG is recorded in the waking state. There was no seizure or seizure predisposition recorded on this study. Please note that lack of epileptiform activity on EEG does not preclude the possibility of epilepsy. Ritta Slot, MD [facility] [phone] If 7pm- 7am, please page neurology on call as listed in AMION.   CT Head Wo Contrast  Result Date: 12/28/22 CLINICAL DATA:  [name].  Head trauma. EXAM: CT HEAD WITHOUT CONTRAST CT CERVICAL SPINE WITHOUT CONTRAST TECHNIQUE: Multidetector CT imaging of the head and cervical spine was performed following the standard protocol without intravenous contrast. Multiplanar CT image reconstructions of the cervical spine were also generated. RADIATION DOSE REDUCTION: This exam was performed according to the departmental dose-optimization program which includes automated exposure control, adjustment of the mA and/or kV according to patient size and/or use of iterative  reconstruction technique. COMPARISON:  Head CT 09/27/2014 FINDINGS: CT HEAD FINDINGS Brain: Mild age advanced cerebral atrophy, ventriculomegaly and periventricular white matter disease. No acute intracranial findings such as hemispheric infarction or intracranial hemorrhage. No mass lesions. Brainstem and cerebellum are grossly normal. There are mild changes of cerebellar atrophy. Vascular: Vascular calcifications but no aneurysm or hyperdense vessels. Skull: No skull fracture or bone lesions. Sinuses/Orbits: No acute paranasal sinus disease. Small mucous retention cyst or polyp noted in the right maxillary sinus. 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 The globes are intact. Other: Enlarging fatty lesion in the left frontal scalp area likely benign scalp lipoma. CT CERVICAL SPINE FINDINGS Alignment: Normal overall alignment. Skull base and vertebrae: No acute fracture. No primary bone lesion or focal pathologic process. Soft tissues and spinal canal: No prevertebral fluid or swelling. No visible canal hematoma. Disc levels: Advanced degenerative cervical spondylosis with advanced degenerative disc disease at C4-5, C5-6 and C6-7. No significant canal stenosis. Mild bony foraminal stenosis at these levels due to uncinate spurring and facet disease. Upper chest: The lung apices demonstrate emphysematous changes. Other: Bilateral carotid artery calcifications. IMPRESSION: 1. Age advanced cerebral atrophy, ventriculomegaly and periventricular white matter disease. 2. No acute intracranial  findings or skull fracture. 3. Advanced degenerative cervical spondylosis but no acute cervical spine fracture. Electronically Signed   By: Rudie Meyer M.D.   On: 12/22/2022 14:37   CT Cervical Spine Wo Contrast  Result Date: 12/22/2022 CLINICAL DATA:  [name].  Head trauma. EXAM: CT HEAD WITHOUT CONTRAST CT CERVICAL SPINE WITHOUT CONTRAST TECHNIQUE: Multidetector CT imaging of  the head and cervical spine was performed following the standard protocol without intravenous contrast. Multiplanar CT image reconstructions of the cervical spine were also generated. RADIATION DOSE REDUCTION: This exam was performed according to the departmental dose-optimization program which includes automated exposure control, adjustment of the mA and/or kV according to patient size and/or use of iterative reconstruction technique. COMPARISON:  Head CT 09/27/2014 FINDINGS: CT HEAD FINDINGS Brain: Mild age advanced cerebral atrophy, ventriculomegaly and periventricular white matter disease. No acute intracranial findings such as hemispheric infarction or intracranial hemorrhage. No mass lesions. Brainstem and cerebellum are grossly normal. There are mild changes of cerebellar atrophy. Vascular: Vascular calcifications but no aneurysm or hyperdense vessels. Skull: No skull fracture or bone lesions. Sinuses/Orbits: No acute paranasal sinus disease. Small mucous retention cyst or polyp noted in the right maxillary sinus. 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 The globes are intact. Other: Enlarging fatty lesion in the left frontal scalp area likely benign scalp lipoma. CT CERVICAL SPINE FINDINGS Alignment: Normal overall alignment. Skull base and vertebrae: No acute fracture. No primary bone lesion or focal pathologic process. Soft tissues and spinal canal: No prevertebral fluid or swelling. No visible canal hematoma. Disc levels: Advanced degenerative cervical spondylosis with advanced degenerative disc disease at C4-5, C5-6 and C6-7. No significant canal stenosis. Mild bony foraminal stenosis at these levels due to uncinate spurring and facet disease. Upper chest: The lung apices demonstrate emphysematous changes. Other: Bilateral carotid artery calcifications. IMPRESSION: 1. Age advanced cerebral atrophy, ventriculomegaly and periventricular white matter  disease. 2. No acute intracranial findings or skull fracture. 3. Advanced degenerative cervical spondylosis but no acute cervical spine fracture. Electronically Signed   By: Rudie Meyer M.D.   On: 12/22/2022 14:37      Subjective: Patient seen and examined at bedside today.  Hemodynamically stable for discharge.  Alert and oriented.  Extensive counseling done about quitting alcohol  Discharge Exam: Vitals:   01/05/23 0338 01/05/23 0959  BP: 129/81 130/75  Pulse: 88 86  Resp: 18   Temp: 98.3 F (36.8 C)   SpO2: 100%    Vitals:   01/04/23 1218 01/04/23 2018 01/05/23 0338 01/05/23 0959  BP: (!) 123/90 134/75 129/81 130/75  Pulse: 87 76 88 86  Resp: 18 18 18    Temp: 98.9 F (37.2 C) 97.6 F (36.4 C) 98.3 F (36.8 C)   TempSrc: Oral Oral Oral   SpO2: 100% 100% 100%   Weight:        General: Pt is alert, awake, not in acute distress Cardiovascular: RRR, S1/S2 +, no rubs, no gallops Respiratory: CTA bilaterally, no wheezing, no rhonchi Abdominal: Soft, NT, ND, bowel sounds + Extremities: no edema, no cyanosis    The results of significant diagnostics from this hospitalization (including imaging, microbiology, ancillary and laboratory) are listed below for reference.     Microbiology: No results found for this or any previous visit (from the past 240 hour(s)).   Labs: BNP (last 3 results) No results for input(s): "BNP" in the last 8760 hours. Basic Metabolic Panel: Recent Labs  Lab 01/01/23 1331  01/03/23 1120 01/03/23 2140 01/04/23 0557  NA 140 137  --  132*  K 4.2 3.8  --  4.3  CL 106 103  --  100  CO2 17* 21*  --  22  GLUCOSE 98 91  --  94  BUN 4* <5*  --  11  CREATININE 0.65* 0.69  --  0.69  CALCIUM 8.5* 8.8*  --  8.6*  MG  --   --  2.1 2.0  PHOS  --   --   --  3.3   Liver Function Tests: Recent Labs  Lab 01/01/23 1331 01/03/23 1120 01/04/23 0557  AST 28 31 26   ALT 20 20 16   ALKPHOS 60 52 44  BILITOT <0.2 0.6 0.8  PROT 6.8 8.4* 6.8  ALBUMIN  3.8* 4.0 3.2*   No results for input(s): "LIPASE", "AMYLASE" in the last 168 hours. No results for input(s): "AMMONIA" in the last 168 hours. CBC: Recent Labs  Lab 01/03/23 1120 01/04/23 0557  WBC 7.6 8.1  NEUTROABS  --  5.7  HGB 15.0 13.4  HCT 44.7 38.5*  MCV 104.2* 102.4*  PLT 237 203   Cardiac Enzymes: No results for input(s): "CKTOTAL", "CKMB", "CKMBINDEX", "TROPONINI" in the last 168 hours. BNP: Invalid input(s): "POCBNP" CBG: No results for input(s): "GLUCAP" in the last 168 hours. D-Dimer No results for input(s): "DDIMER" in the last 72 hours. Hgb A1c No results for input(s): "HGBA1C" in the last 72 hours. Lipid Profile No results for input(s): "CHOL", "HDL", "LDLCALC", "TRIG", "CHOLHDL", "LDLDIRECT" in the last 72 hours. Thyroid function studies No results for input(s): "TSH", "T4TOTAL", "T3FREE", "THYROIDAB" in the last 72 hours.  Invalid input(s): "FREET3" Anemia work up No results for input(s): "VITAMINB12", "FOLATE", "FERRITIN", "TIBC", "IRON", "RETICCTPCT" in the last 72 hours. Urinalysis    Component Value Date/Time   COLORURINE YELLOW 12/22/2022 1311   APPEARANCEUR CLEAR 12/22/2022 1311   LABSPEC 1.016 12/22/2022 1311   PHURINE 5.0 12/22/2022 1311   GLUCOSEU NEGATIVE 12/22/2022 1311   HGBUR MODERATE (A) 12/22/2022 1311   BILIRUBINUR NEGATIVE 12/22/2022 1311   KETONESUR 20 (A) 12/22/2022 1311   PROTEINUR 100 (A) 12/22/2022 1311   UROBILINOGEN 1.0 12/31/2014 2005   NITRITE NEGATIVE 12/22/2022 1311   LEUKOCYTESUR NEGATIVE 12/22/2022 1311   Sepsis Labs Recent Labs  Lab 01/03/23 1120 01/04/23 0557  WBC 7.6 8.1   Microbiology No results found for this or any previous visit (from the past 240 hour(s)).  Please note: You were cared for by a hospitalist during your hospital stay. Once you are discharged, your primary care physician will handle any further medical issues. Please note that NO REFILLS for any discharge medications will be authorized  once you are discharged, as it is imperative that you return to your primary care physician (or establish a relationship with a primary care physician if you do not have one) for your post hospital discharge needs so that they can reassess your need for medications and monitor your lab values.    Time coordinating discharge: 40 minutes  SIGNED:   Burnadette Pop, MD  [facility] 01/05/2023, 1:29 PM Pager [phone]  If 7PM-7AM, please contact night-coverage [email or web address] Password [email or web address]

## 2023-01-05 NOTE — TOC Transition Note (Signed)
Transition of Care [facility]) - CM/SW Discharge Note   Patient Details  Name: Chad Golden MRN: [id number] Date of Birth: Nov 26, 1960  [facility] CM/SW Contact:  Otelia Santee, LCSW Phone Number: 01/05/2023, 12:07 PM   Clinical Narrative:    Reviewed SA resources with pt and process of calling rehab facilities for placement. Pt requested TOC speak with sister as he shares she does not always trust what he tells her. CSW left voicemail for pt's sister.     Final next level of care: Home/Self Care Barriers to Discharge: Barriers Resolved   Patient Goals and CMS Choice CMS Medicare.gov Compare Post Acute Care list provided to:: Patient Choice offered to / list presented to : Patient  Discharge Placement                         Discharge Plan and Services Additional resources added to the After Visit Summary for     Discharge Planning Services: CM Consult            DME Arranged: N/A DME Agency: NA                  Social Determinants of Health (SDOH) Interventions SDOH Screenings   Food Insecurity: Food Insecurity Present (01/03/2023)  Housing: Low Risk  (01/05/2023)  Transportation Needs: No Transportation Needs (01/03/2023)  Utilities: Not At Risk (01/03/2023)  Depression (PHQ2-9): High Risk (12/31/2022)  Tobacco Use: High Risk (01/03/2023)     Readmission Risk Interventions    01/04/2023   12:20 PM  Readmission Risk Prevention Plan  Post Dischage Appt Complete  Medication Screening Complete  Transportation Screening Complete

## 2023-01-05 NOTE — Discharge Instructions (Addendum)
Psychiatry  We have recommended a medication called gabentin. This should help with cravings and the nerves that drive you to drink.   You will take 1 pill for a week at night - if after a week you feel like you need more, it is OK to add a pill in the AM. Please wait to see how this pill affects you before driving.   Good luck finding a residential program and getting back into AA. As we discussed, if you ever have suicidal thoughts, homicidal thoughts, or hallucinations please go to the nearest ED.

## 2023-01-05 NOTE — Consult Note (Signed)
[facility] Psychiatry New Face-to-Face Psychiatric Evaluation   Service Date: Jan 05, 2023 LOS:  LOS: 1 day    Assessment  Chad Golden is a 62 y.o. male admitted medically for 01/03/2023 10:39 AM for management of EtOH w/d. He carries the psychiatric diagnoses of alcohol use disorder and has a past medical history of  alcohol abuse and seizures.Psychiatry was consulted for chronic alcohol abuse  by Dr. Renford Dills.    Patient's presentation of chronic alcohol abuse requiring inpatient hospitalization to stop drinking is consistent with alcohol use disorder, severe.  He also meets criteria for generalized anxiety disorder.  He did not meet criteria for any other major Axis I disorder and specifically denied suicidal ideations.  Notably he had a 7-year period of sobriety prior to his 16-month relapse.  He had been evaluated by a psychiatric nurse practitioner in the emergency department who recommended admission to the [facility]; unfortunately as he was admitted to the hospital he is no longer eligible to go to that facility.  He has never seen a psychiatrist nor has he ever taken psychotropic medications.  When I saw him, he was open to medication assistance to achieve his ultimate goal of sobriety and identified nerve/anxiety as his primary driver to drink.  He consented to a trial of gabapentin for alcohol use after discussion of risk/benefits/side effects.  Diagnoses:  Active Hospital problems: Principal Problem:   Alcohol abuse Active Problems:   Alcohol withdrawal (HCC)   Acute alcohol intoxication (HCC)   Tobacco abuse     Plan  ## Safety and Observation Level:  - Based on my clinical evaluation, I estimate the patient to be at low risk of self harm in the current setting - At this time, we recommend a routine level of observation. This decision is based on my review of the chart including patient's history and current presentation, interview of the patient, mental status  examination, and consideration of suicide risk including evaluating suicidal ideation, plan, intent, suicidal or self-harm behaviors, risk factors, and protective factors. This judgment is based on our ability to directly address suicide risk, implement suicide prevention strategies and develop a safety plan while the patient is in the clinical setting. Please contact our team if there is a concern that risk level has changed.   ## Medications:  -- s gabapentin 300 QHS, OK to inc to 300 BID if still having cravings in 1 week   Would continue thiamine supplementation in this pt with EtOH use d/o and elevated MCV.    ## Medical Decision Making Capacity:  Not formally assessed  ## Further Work-up:  -- none, pt discharging   -- No EKG this hospitalization, last non-concerning. Gabapentin is not Qtc prolonging -- Pertinent labwork reviewed earlier this admission includes: ethanol 328 at presentation, MCV elevated  ## Disposition:  -- per primary     Thank you for this consult request. Recommendations have been communicated to the primary team.  We will sign off at this time.   Chad Golden   Patient report  Relevant Aspects of Hospital Course:  Admitted on 01/03/2023 for management of alcohol withdrawal.  Patient Report:  Patient was seen in the early afternoon.  He was alert and fully oriented.  He was initially hesitant to speak to a psychiatrist, but was largely forthcoming with all of his responses and eventually opened up throughout the course of the interview.   He denies any history of needing to see a psychiatrist and does  not feel he has had major episodes of depression.  He does endorse that his main driver to drink is his "nerves" and getting worked up about things.  States he had a previous 7-year period of sobriety and is hoping he can use some of the same strategies to achieve sobriety this time.   We had a long patient centered discussion about gabapentin versus  naltrexone and he felt like gabapentin would be a better fit for him; thoroughly discussed risk/benefits and side effects specifically GI distress, liver injury, and lower efficacy of opioid based analgesics.  He denied suicidality and was future oriented throughout interview as evidenced by asking for a doctor's note (informed primary) and his desire to go to rehab, find a sponsor, and be able to start withdrawing Social Security in 2 years.  ROS:  Patient endorses some difficulty with sleep and nerves.  Otherwise largely denies symptoms of depression.  Brief screen for bipolar disorder negative.  Brief screen for psychotic spectrum disorder negative.  Collateral information:  None obtained  Psychiatric History:  Information collected from pt Has never seen a psychiatrist or been on psychotropic medications.  Has never had suicidal ideations.  Has never had homicidal ideations and has never had hallucinations.  Has never seen a therapist but was active in AA for some time.     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  Tobacco use: yes Alcohol use: yes Drug use: no  Family History:  The patient's family history includes Alcohol abuse in his father; Healthy in his mother.  Medical History: Past Medical History:  Diagnosis Date   Alcohol abuse    Seizures (HCC)     Surgical History: History reviewed. No pertinent surgical history.  Medications:   Current Facility-Administered Medications:    acetaminophen (TYLENOL) tablet 650 mg, 650 mg, Oral, Q6H PRN, 650 mg at 01/05/23 0955 **OR** acetaminophen (TYLENOL) suppository 650 mg, 650 mg, Rectal, Q6H PRN, Howerter, Justin B, DO   folic acid (FOLVITE) tablet 1 mg, 1 mg, Oral, Daily, Howerter, Justin B, DO, 1 mg at 01/05/23 0950   gabapentin (NEURONTIN) capsule 300 mg, 300 mg, Oral, QHS, Adhikari, Amrit, MD   LORazepam (ATIVAN) injection 0-4 mg, 0-4 mg, Intravenous, Q12H  **OR** LORazepam (ATIVAN) tablet 0-4 mg, 0-4 mg, Oral, Q12H, Benjiman Core, MD   melatonin tablet 3 mg, 3 mg, Oral, QHS PRN, Howerter, Justin B, DO   multivitamin (PROSIGHT) tablet 1 tablet, 1 tablet, Oral, Daily, Howerter, Justin B, DO, 1 tablet at 01/05/23 0950   nicotine (NICODERM CQ - dosed in mg/24 hours) patch 14 mg, 14 mg, Transdermal, Daily PRN, Howerter, Justin B, DO, 14 mg at 01/04/23 2257   ondansetron (ZOFRAN) injection 4 mg, 4 mg, Intravenous, Q6H PRN, Howerter, Justin B, DO   thiamine (VITAMIN B1) tablet 100 mg, 100 mg, Oral, Daily, 100 mg at 01/05/23 0950 **OR** thiamine (VITAMIN B1) injection 100 mg, 100 mg, Intravenous, Daily, Benjiman Core, MD, 100 mg at 01/03/23 1432  Allergies: Allergies  Allergen Reactions   Milk-Related Compounds Nausea And Vomiting       Objective  Vital signs:  Temp:  [97.6 F (36.4 C)-98.3 F (36.8 C)] 98.2 F (36.8 C) (05/04 1339) Pulse Rate:  [76-88] 77 (05/04 1339) Resp:  [16-18] 16 (05/04 1339) BP: (129-138)/(75-89) 138/89 (05/04 1339) SpO2:  [100 %] 100 % (05/04 1339)  Psychiatric Specialty Exam:  Presentation  General Appearance:  Appropriate for Environment  Eye Contact:  Fleeting  Speech: Clear and Coherent  Speech Volume: Normal  Handedness:No data recorded  Mood and Affect  Mood: Anxious; Euthymic  Affect: Appropriate; Flat   Thought Process  Thought Processes: Coherent  Descriptions of Associations:Intact  Orientation:Full (Time, Place and Person)  Thought Content:WDL  History of Schizophrenia/Schizoaffective disorder:No data recorded Duration of Psychotic Symptoms:No data recorded Hallucinations:No data recorded Ideas of Reference:None  Suicidal Thoughts:No data recorded Homicidal Thoughts:No data recorded  Sensorium  Memory: Immediate Good; Recent Fair; Remote Fair  Judgment: Impaired  Insight: Poor   Executive Functions  Concentration: Fair  Attention  Span: Fair  Recall: Good  Fund of Knowledge: Good  Language: Good   Psychomotor Activity  Psychomotor Activity:No data recorded  Assets  Assets: Communication Skills; Housing; Social Support   Sleep  Sleep:No data recorded   Physical Exam: Physical Exam HENT:     Head: Normocephalic.  Eyes:     Conjunctiva/sclera: Conjunctivae normal.  Pulmonary:     Effort: Pulmonary effort is normal.  Neurological:     Mental Status: He is alert and oriented to person, place, and time.     Blood pressure 138/89, pulse 77, temperature 98.2 F (36.8 C), temperature source Oral, resp. rate 16, weight 56 kg, SpO2 100 %. Body mass index is 19.93 kg/m.

## 2023-01-07 ENCOUNTER — Encounter: Payer: Self-pay | Admitting: Nurse Practitioner

## 2023-01-07 ENCOUNTER — Ambulatory Visit (INDEPENDENT_AMBULATORY_CARE_PROVIDER_SITE_OTHER): Payer: BLUE CROSS/BLUE SHIELD | Admitting: Nurse Practitioner

## 2023-01-07 VITALS — BP 119/58 | HR 92 | Temp 97.8°F | Ht 66.0 in | Wt 123.6 lb

## 2023-01-07 DIAGNOSIS — Z23 Encounter for immunization: Secondary | ICD-10-CM | POA: Diagnosis not present

## 2023-01-07 DIAGNOSIS — Z720 Tobacco use: Secondary | ICD-10-CM | POA: Diagnosis not present

## 2023-01-07 DIAGNOSIS — F1010 Alcohol abuse, uncomplicated: Secondary | ICD-10-CM

## 2023-01-07 DIAGNOSIS — M79601 Pain in right arm: Secondary | ICD-10-CM | POA: Diagnosis not present

## 2023-01-07 DIAGNOSIS — E871 Hypo-osmolality and hyponatremia: Secondary | ICD-10-CM

## 2023-01-07 DIAGNOSIS — Z09 Encounter for follow-up examination after completed treatment for conditions other than malignant neoplasm: Secondary | ICD-10-CM

## 2023-01-07 DIAGNOSIS — I959 Hypotension, unspecified: Secondary | ICD-10-CM

## 2023-01-07 MED ORDER — DICLOFENAC SODIUM 1 % EX GEL
4.0000 g | Freq: Four times a day (QID) | CUTANEOUS | 0 refills | Status: AC
Start: 2023-01-07 — End: ?

## 2023-01-07 MED ORDER — NICOTINE 14 MG/24HR TD PT24
14.0000 mg | MEDICATED_PATCH | Freq: Every day | TRANSDERMAL | 0 refills | Status: AC | PRN
Start: 2023-01-07 — End: ?

## 2023-01-07 MED ORDER — GABAPENTIN 300 MG PO CAPS
300.0000 mg | ORAL_CAPSULE | Freq: Every day | ORAL | 1 refills | Status: AC
Start: 2023-01-07 — End: ?

## 2023-01-07 NOTE — Assessment & Plan Note (Signed)
Patient encouraged to avoid drinking alcohol or drink responsibly Continue folic acid 1 mg daily, thiamine 100 mg daily. Take gabapentin 300 mg at bedtime for alcohol craving Follow-up for alcohol cessation counseling

## 2023-01-07 NOTE — Assessment & Plan Note (Signed)
Patient educated on CDC recommendation for the shingles vaccine. Verbal consent was obtained from the patient, vaccine administered by nurse, no sign of adverse reactions noted at this time. Patient education on arm soreness and use of tylenol or ibuprofen (if safe) for this patient  was discussed. Patient educated on the signs and symptoms of adverse effect and advise to contact the office if they occur.  

## 2023-01-07 NOTE — Assessment & Plan Note (Signed)
Voltaren gel ordered apply 4 times daily as needed On gabapentin 300 mg daily Application of heat or  ice encouraged

## 2023-01-07 NOTE — Assessment & Plan Note (Signed)
BP Readings from Last 3 Encounters:  01/07/23 (!) 119/58  01/05/23 138/89  12/31/22 (!) 83/58  Much improved Patient courage to drink at least 64 ounces of water daily to maintain hydration Continue to avoid  alcohol

## 2023-01-07 NOTE — Assessment & Plan Note (Signed)
Lab Results  Component Value Date   NA 132 (L) 01/04/2023   K 4.3 01/04/2023   CO2 22 01/04/2023   GLUCOSE 94 01/04/2023   BUN 11 01/04/2023   CREATININE 0.69 01/04/2023   CALCIUM 8.6 (L) 01/04/2023   EGFR 106 01/01/2023   GFRNONAA >60 01/04/2023  Patient encouraged to continue to avoid alcohol

## 2023-01-07 NOTE — Progress Notes (Signed)
Established Patient Office Visit  Subjective:  Patient ID: Chad Golden, male    DOB: 01-14-1961  Age: 62 y.o. MRN: [id number]  CC:  Chief Complaint  Patient presents with   Foot Injury    Hospital follow up.    HPI Chad Golden is a 62 y.o. male with past medical history of alcohol abuse, alcohol withdrawal seizures, chronic tobacco abuse who presents for hospital discharge follow-up.  Patient had presented to the emergency department requesting for alcohol detoxification alcohol level was 328.  He denies drinking alcohol since he left the hospital 2 days ago.  He has a [facility] outpatient counseling for substance abuse and he has started making calls to the offices on the list.  Gabapentin 300 mg at bedtime ordered for alcohol craving.    Due for shingles vaccine and Tdap vaccine but vaccine was administered in the office today  Patient complains of right arm pain that started about 2 weeks ago, no swelling or numbness.  Taking Tylenol but no improvements.   Patient currently denies fever, chills, malaise, chest pain, shortness of breath, abdominal pain, nausea, vomiting dizziness.   Past Medical History:  Diagnosis Date   Alcohol abuse    Seizures (HCC)     History reviewed. No pertinent surgical history.  Family History  Problem Relation Age of Onset   Healthy Mother    Alcohol abuse Father     Social History   Socioeconomic History   Marital status: Single    Spouse name: Not on file   Number of children: 0   Years of education: 12   Highest education level: Not on file  Occupational History   Occupation: Consulting civil engineer    Comment: Clinical biochemist  Tobacco Use   Smoking status: Every Day    Packs/day: 0.50    Years: 35.00    Additional pack years: 0.00    Total pack years: 17.50    Types: Cigarettes   Smokeless tobacco: Never  Substance and Sexual Activity   Alcohol use: Yes   Drug use: No   Sexual activity: Not on  file  Other Topics Concern   Not on file  Social History Narrative   Not on file   Social Determinants of Health   Financial Resource Strain: Not on file  Food Insecurity: Food Insecurity Present (01/03/2023)   Hunger Vital Sign    Worried About Running Out of Food in the Last Year: Sometimes true    Ran Out of Food in the Last Year: Often true  Transportation Needs: No Transportation Needs (01/03/2023)   PRAPARE - Administrator, Civil Service (Medical): No    Lack of Transportation (Non-Medical): No  Physical Activity: Not on file  Stress: Not on file  Social Connections: Not on file  Intimate Partner Violence: Not At Risk (01/03/2023)   Humiliation, Afraid, Rape, and Kick questionnaire    Fear of Current or Ex-Partner: No    Emotionally Abused: No    Physically Abused: No    Sexually Abused: No    Outpatient Medications Prior to Visit  Medication Sig Dispense Refill   acetaminophen (TYLENOL) 500 MG tablet Take 1 tablet (500 mg total) by mouth every 6 (six) hours as needed. 30 tablet 0   folic acid (FOLVITE) 1 MG tablet Take 1 tablet (1 mg total) by mouth daily. 100 tablet 0   thiamine (VITAMIN B-1) 100 MG tablet Take 1 tablet (100 mg total) by mouth  daily. 100 tablet 0   gabapentin (NEURONTIN) 300 MG capsule Take 1 capsule (300 mg total) by mouth at bedtime. If you continue to have alcohol craving after a week ,start taking twice a day(one in AM and one in PM) 60 capsule 0   nicotine (NICODERM CQ - DOSED IN MG/24 HOURS) 14 mg/24hr patch Place 1 patch (14 mg total) onto the skin daily as needed (smoking cessation). 28 patch 0   Hyprom-Naphaz-Polysorb-Zn Sulf (CLEAR EYES COMPLETE OP) Apply 1 drop to eye as needed (Dry eye). (Patient not taking: Reported on 01/07/2023)     No facility-administered medications prior to visit.    Allergies  Allergen Reactions   Milk-Related Compounds Nausea And Vomiting    ROS Review of Systems  Constitutional:  Negative for activity  change, appetite change, chills, diaphoresis, fatigue, fever and unexpected weight change.  HENT:  Negative for congestion, dental problem, drooling and ear discharge.   Eyes:  Negative for pain, discharge, redness and itching.  Respiratory:  Negative for apnea, cough, choking, chest tightness, shortness of breath and wheezing.   Cardiovascular: Negative.  Negative for chest pain, palpitations and leg swelling.  Gastrointestinal:  Negative for abdominal distention, abdominal pain, anal bleeding, blood in stool, constipation, diarrhea and vomiting.  Endocrine: Negative for polydipsia, polyphagia and polyuria.  Genitourinary:  Negative for difficulty urinating, flank pain, frequency and genital sores.  Musculoskeletal:  Positive for arthralgias. Negative for back pain, gait problem and joint swelling.  Skin:  Negative for color change, pallor and rash.  Neurological:  Negative for dizziness, facial asymmetry, light-headedness, numbness and headaches.  Psychiatric/Behavioral:  Negative for agitation, behavioral problems, confusion, hallucinations, self-injury, sleep disturbance and suicidal ideas.       Objective:    Physical Exam Vitals and nursing note reviewed.  Constitutional:      General: He is not in acute distress.    Appearance: Normal appearance. He is obese. He is not ill-appearing, toxic-appearing or diaphoretic.  HENT:     Mouth/Throat:     Mouth: Mucous membranes are moist.     Pharynx: Oropharynx is clear. No oropharyngeal exudate or posterior oropharyngeal erythema.  Eyes:     General: No scleral icterus.       Right eye: No discharge.        Left eye: No discharge.     Extraocular Movements: Extraocular movements intact.     Conjunctiva/sclera: Conjunctivae normal.  Cardiovascular:     Rate and Rhythm: Normal rate and regular rhythm.     Pulses: Normal pulses.     Heart sounds: Normal heart sounds. No murmur heard.    No friction rub. No gallop.  Pulmonary:      Effort: Pulmonary effort is normal. No respiratory distress.     Breath sounds: Normal breath sounds. No stridor. No wheezing, rhonchi or rales.  Chest:     Chest wall: No tenderness.  Abdominal:     General: There is no distension.     Palpations: Abdomen is soft.     Tenderness: There is no abdominal tenderness. There is no right CVA tenderness, left CVA tenderness or guarding.  Musculoskeletal:        General: Tenderness present. No swelling, deformity or signs of injury.     Right lower leg: No edema.     Left lower leg: No edema.     Comments: Tenderness on palpation of right upper arm, skin warm and dry no swelling or redness noted has palpable radial pulse  Skin:    General: Skin is warm and dry.     Capillary Refill: Capillary refill takes less than 2 seconds.     Coloration: Skin is not jaundiced or pale.     Findings: No bruising, erythema or lesion.  Neurological:     Mental Status: He is alert and oriented to person, place, and time.     Motor: No weakness.     Coordination: Coordination normal.     Gait: Gait normal.  Psychiatric:        Mood and Affect: Mood normal.        Behavior: Behavior normal.        Thought Content: Thought content normal.        Judgment: Judgment normal.     BP (!) 119/58   Pulse 92   Temp 97.8 F (36.6 C)   Ht 5\' 6"  (1.676 m)   Wt 123 lb 9.6 oz (56.1 kg)   SpO2 100%   BMI 19.95 kg/m  Wt Readings from Last 3 Encounters:  01/07/23 123 lb 9.6 oz (56.1 kg)  01/03/23 123 lb 7.3 oz (56 kg)  12/31/22 125 lb 9.6 oz (57 kg)    Lab Results  Component Value Date   TSH 2.396 12/23/2022   Lab Results  Component Value Date   WBC 8.1 01/04/2023   HGB 13.4 01/04/2023   HCT 38.5 (L) 01/04/2023   MCV 102.4 (H) 01/04/2023   PLT 203 01/04/2023   Lab Results  Component Value Date   NA 132 (L) 01/04/2023   K 4.3 01/04/2023   CO2 22 01/04/2023   GLUCOSE 94 01/04/2023   BUN 11 01/04/2023   CREATININE 0.69 01/04/2023   BILITOT 0.8  01/04/2023   ALKPHOS 44 01/04/2023   AST 26 01/04/2023   ALT 16 01/04/2023   PROT 6.8 01/04/2023   ALBUMIN 3.2 (L) 01/04/2023   CALCIUM 8.6 (L) 01/04/2023   ANIONGAP 10 01/04/2023   EGFR 106 01/01/2023   Lab Results  Component Value Date   CHOL 207 (H) 12/23/2022   Lab Results  Component Value Date   HDL 71 12/23/2022   Lab Results  Component Value Date   LDLCALC 116 (H) 12/23/2022   Lab Results  Component Value Date   TRIG 99 12/23/2022   Lab Results  Component Value Date   CHOLHDL 2.9 12/23/2022   Lab Results  Component Value Date   HGBA1C 5.2 12/23/2022      Assessment & Plan:   Problem List Items Addressed This Visit       Cardiovascular and Mediastinum   Hypotension    BP Readings from Last 3 Encounters:  01/07/23 (!) 119/58  01/05/23 138/89  12/31/22 (!) 83/58  Much improved Patient courage to drink at least 64 ounces of water daily to maintain hydration Continue to avoid  alcohol        Other   Alcohol abuse - Primary    Patient encouraged to avoid drinking alcohol or drink responsibly Continue folic acid 1 mg daily, thiamine 100 mg daily. Take gabapentin 300 mg at bedtime for alcohol craving Follow-up for alcohol cessation counseling      Relevant Medications   gabapentin (NEURONTIN) 300 MG capsule   Hyponatremia    Lab Results  Component Value Date   NA 132 (L) 01/04/2023   K 4.3 01/04/2023   CO2 22 01/04/2023   GLUCOSE 94 01/04/2023   BUN 11 01/04/2023   CREATININE 0.69 01/04/2023   CALCIUM 8.6 (  L) 01/04/2023   EGFR 106 01/01/2023   GFRNONAA >60 01/04/2023  Patient encouraged to continue to avoid alcohol      Tobacco abuse    Smokes half pack of cigarette daily need to quit smoking tobacco discussed      Relevant Medications   nicotine (NICODERM CQ - DOSED IN MG/24 HOURS) 14 mg/24hr patch   Hospital discharge follow-up    Hospital discharge summary labs imaging studies reviewed by me today Patient encouraged to avoid  alcohol and follow-up for counseling      Right arm pain    Voltaren gel ordered apply 4 times daily as needed On gabapentin 300 mg daily Application of heat or  ice encouraged      Relevant Medications   diclofenac Sodium (VOLTAREN) 1 % GEL   Need for shingles vaccine    Patient educated on CDC recommendation for the shingles vaccine. Verbal consent was obtained from the patient, vaccine administered by nurse, no sign of adverse reactions noted at this time. Patient education on arm soreness and use of tylenol or ibuprofen (if safe) for this patient  was discussed. Patient educated on the signs and symptoms of adverse effect and advise to contact the office if they occur.       Relevant Orders   Zoster Recombinant (Shingrix ) (Completed)   Need for diphtheria-tetanus-pertussis (Tdap) vaccine    Patient educated on CDC recommendation for the TDAP vaccine. Verbal consent was obtained from the patient, vaccine administered by nurse, no sign of adverse reactions noted at this time. Patient education on arm soreness and use of tylenol or ibuprofen (if safe) for this patient  was discussed. Patient educated on the signs and symptoms of adverse effect and advise to contact the office if they occur.       Relevant Orders   Tdap vaccine greater than or equal to 7yo IM (Completed)    Meds ordered this encounter  Medications   gabapentin (NEURONTIN) 300 MG capsule    Sig: Take 1 capsule (300 mg total) by mouth at bedtime. If you continue to have alcohol craving after a week ,start taking twice a day(one in AM and one in PM)    Dispense:  60 capsule    Refill:  1   nicotine (NICODERM CQ - DOSED IN MG/24 HOURS) 14 mg/24hr patch    Sig: Place 1 patch (14 mg total) onto the skin daily as needed (smoking cessation).    Dispense:  28 patch    Refill:  0   diclofenac Sodium (VOLTAREN) 1 % GEL    Sig: Apply 4 g topically 4 (four) times daily.    Dispense:  50 g    Refill:  0    Follow-up:  Return in about 3 months (around 04/09/2023).    Donell Beers, FNP

## 2023-01-07 NOTE — Patient Instructions (Signed)

## 2023-01-07 NOTE — Assessment & Plan Note (Addendum)
Hospital discharge summary labs,  reviewed by me today Patient encouraged to avoid alcohol and follow-up for counseling

## 2023-01-07 NOTE — Assessment & Plan Note (Signed)
Smokes half pack of cigarette daily need to quit smoking tobacco discussed

## 2023-01-07 NOTE — Assessment & Plan Note (Signed)
Patient educated on CDC recommendation for the TDAP vaccine. Verbal consent was obtained from the patient, vaccine administered by nurse, no sign of adverse reactions noted at this time. Patient education on arm soreness and use of tylenol or ibuprofen (if safe) for this patient  was discussed. Patient educated on the signs and symptoms of adverse effect and advise to contact the office if they occur.  

## 2023-01-08 ENCOUNTER — Other Ambulatory Visit: Payer: Self-pay

## 2023-01-08 DIAGNOSIS — M79601 Pain in right arm: Secondary | ICD-10-CM

## 2023-01-11 ENCOUNTER — Ambulatory Visit (INDEPENDENT_AMBULATORY_CARE_PROVIDER_SITE_OTHER): Payer: BLUE CROSS/BLUE SHIELD | Admitting: Physician Assistant

## 2023-01-11 ENCOUNTER — Encounter: Payer: Self-pay | Admitting: Physician Assistant

## 2023-01-11 ENCOUNTER — Other Ambulatory Visit (INDEPENDENT_AMBULATORY_CARE_PROVIDER_SITE_OTHER): Payer: BLUE CROSS/BLUE SHIELD

## 2023-01-11 DIAGNOSIS — M75121 Complete rotator cuff tear or rupture of right shoulder, not specified as traumatic: Secondary | ICD-10-CM

## 2023-01-11 DIAGNOSIS — M79601 Pain in right arm: Secondary | ICD-10-CM

## 2023-01-11 DIAGNOSIS — M75101 Unspecified rotator cuff tear or rupture of right shoulder, not specified as traumatic: Secondary | ICD-10-CM | POA: Insufficient documentation

## 2023-01-11 NOTE — Progress Notes (Signed)
Office Visit Note   Patient: Chad Golden           Date of Birth: Jun 21, 1961           MRN: [id number] Visit Date: 01/11/2023              Requested by: Donell Beers, FNP [id number] [place],,  [place] [address] PCP: Donell Beers, FNP   Assessment & Plan: Visit Diagnoses:  1. Right arm pain   2. Complete tear of right rotator cuff, unspecified whether traumatic     Plan: Patient is a former patient of Dr. Ophelia Charter.  He comes in today with a chief complaint of right shoulder pain.  He has a previous history of cervical stenosis fairly advanced.  In 2018 was discussed surgery with Dr Ophelia Charter.  He does not recall having any problems with his neck.  The shoulder pain has been going on for about 3 weeks and he gives a recollection of a fall though he said he had some problems prior to this.  He initially had pain but now has weakness and cannot hold the arm above his head for very long and cannot actively abduct his arm.  I do have concerns for an acute rotator cuff tear.  I think he should go forward with an MRI.  He is unsure of this but wants to know if he can just exercise his arm to get full use back given he is right-handed I have encouraged him that if he does have a significant rotator cuff tear would probably recommend surgery.  He is willing to get the MRI will follow-up afterwards with a phone call.  If he has a full rotator cuff tear can refer him to one of our shoulder specialists of note he was recently fully evaluated in the emergency room.  Within the last week was seen in the emergency room for evaluation after alcohol intoxication.  There is no evidence of any medical cause for his right shoulder weakness  Follow-Up Instructions: Return after mri.  Orders:  Orders Placed This Encounter  Procedures   XR Shoulder Right   MR SHOULDER RIGHT WO CONTRAST   No orders of the defined types were placed in this encounter.     Procedures: No procedures  performed   Clinical Data: No additional findings.   Subjective: Chief Complaint  Patient presents with   right arm pain    HPI patient is a 62 year old gentleman with a 2-week history of right shoulder pain and weakness.Marland Kitchen  He gives a vague history of falling on his shoulder a few weeks ago.  He thinks he had problems with the shoulder before.  Rates his pain as mild but was moderate in the past he is more concerned because he cannot hold his arm up above his head or bring his arm out to his side or in front of him without using the assistance of his other arm.  Denies any radicular findings like numbing or tingling.  Review of Systems  All other systems reviewed and are negative.    Objective: Vital Signs: There were no vitals taken for this visit.  Physical Exam Constitutional:      Appearance: Normal appearance.  Pulmonary:     Effort: Pulmonary effort is normal.  Skin:    General: Skin is warm and dry.  Neurological:     General: No focal deficit present.     Mental Status: He is alert.  Ortho Exam Right shoulder he has no ecchymosis no erythema no swelling he is nontender to palpation.  He needs the assistant of his left arm to bring his arm up above his head but can hold it there for few seconds.  Does not have good control of the arm coming down.  Can easily do biceps and triceps exercises.  Has difficulty abducting his arm.  Again has to use the assistance of his other arm.  Impingement findings difficult to judge as he cannot hold his arm out in front of him with his thumbs down for very long. Specialty Comments:  No specialty comments available.  Imaging: No results found.   PMFS History: Patient Active Problem List   Diagnosis Date Noted   Unspecified rotator cuff tear or rupture of right shoulder, not specified as traumatic 01/11/2023   Hospital discharge follow-up 01/07/2023   Right arm pain 01/07/2023   Need for shingles vaccine 01/07/2023   Need  for diphtheria-tetanus-pertussis (Tdap) vaccine 01/07/2023   Alcohol withdrawal (HCC) 01/03/2023   Acute alcohol intoxication (HCC) 01/03/2023   Tobacco abuse 01/03/2023   Hypotension 12/31/2022   Hyponatremia 12/31/2022   Encounter for smoking cessation counseling 12/31/2022   Depression 12/31/2022   Encounter for examination following treatment at hospital 12/31/2022   Altered mental status 12/22/2022   Alcohol abuse 12/22/2022   Seizures (HCC) 12/22/2022   Degenerative disc disease, cervical 12/07/2016   Past Medical History:  Diagnosis Date   Alcohol abuse    Seizures (HCC)     Family History  Problem Relation Age of Onset   Healthy Mother    Alcohol abuse Father     History reviewed. No pertinent surgical history. Social History   Occupational History   Occupation: Consulting civil engineer    Comment: Clinical biochemist  Tobacco Use   Smoking status: Every Day    Packs/day: 0.50    Years: 35.00    Additional pack years: 0.00    Total pack years: 17.50    Types: Cigarettes   Smokeless tobacco: Never  Substance and Sexual Activity   Alcohol use: Yes   Drug use: No   Sexual activity: Not on file

## 2023-01-11 NOTE — Addendum Note (Signed)
Addended by: Donell Beers on: 01/11/2023 08:58 AM   Modules accepted: Level of Service

## 2023-01-16 NOTE — Addendum Note (Signed)
Addended by: Renelda Loma on: 01/16/2023 01:45 PM   Modules accepted: Level of Service

## 2023-01-17 ENCOUNTER — Other Ambulatory Visit: Payer: Self-pay | Admitting: Physician Assistant

## 2023-01-17 ENCOUNTER — Telehealth: Payer: Self-pay | Admitting: Physician Assistant

## 2023-01-17 ENCOUNTER — Telehealth: Payer: Self-pay

## 2023-01-17 DIAGNOSIS — M75121 Complete rotator cuff tear or rupture of right shoulder, not specified as traumatic: Secondary | ICD-10-CM

## 2023-01-17 NOTE — Telephone Encounter (Signed)
Error

## 2023-01-17 NOTE — Telephone Encounter (Signed)
Please call patient he is very rude and demanding an MRI

## 2023-01-17 NOTE — Telephone Encounter (Signed)
See other note sent to [facility]. She will investigate and reach out to patient on status of MRI

## 2023-01-17 NOTE — Telephone Encounter (Signed)
Cala Bradford with patient care center called stating patient had reached out to them about having loss of function of his arm and needing to know status of MRI scan.  Please reach out to patient about scan. Cala Bradford also relayed patient had questions about being seen today. I tried calling patient to further discuss, no answer. LMVM for him to call us back.

## 2023-01-17 NOTE — Telephone Encounter (Signed)
Patient called. Would like a referral for PT. His call back number is [phone] he had LM on the PT VM

## 2023-01-20 ENCOUNTER — Other Ambulatory Visit: Payer: BLUE CROSS/BLUE SHIELD

## 2023-01-21 NOTE — Telephone Encounter (Signed)
Darl Pikes with [facility] called stating she has tried calling pt several times to make apt and leaves message. She is going to try again now

## 2023-01-21 NOTE — Telephone Encounter (Signed)
Called [facility] to see if MRI has been scheduled yet. Left vm on their machine to return call

## 2023-01-28 LAB — COLOGUARD: COLOGUARD: POSITIVE — AB

## 2023-01-29 ENCOUNTER — Other Ambulatory Visit: Payer: Self-pay | Admitting: Nurse Practitioner

## 2023-01-29 ENCOUNTER — Ambulatory Visit: Payer: Self-pay | Admitting: Nurse Practitioner

## 2023-01-29 DIAGNOSIS — R195 Other fecal abnormalities: Secondary | ICD-10-CM

## 2023-02-12 ENCOUNTER — Telehealth: Payer: Self-pay | Admitting: Physician Assistant

## 2023-02-12 NOTE — Telephone Encounter (Signed)
Patient came in to drop off MRI right shoulder W/O contrast would like [facility] to look over it and call him with results or let him know when he needs to come in for follow up advised pt she will be out this week

## 2023-02-15 NOTE — Telephone Encounter (Signed)
Do you know where he had his MRI done?

## 2023-02-18 NOTE — Telephone Encounter (Signed)
Is there a number I can call to get the report?

## 2023-02-18 NOTE — Telephone Encounter (Signed)
I lvm for report to be faxed. Can you please look out for it?

## 2023-02-18 NOTE — Telephone Encounter (Signed)
You can try [phone] this is the main number.

## 2023-02-18 NOTE — Telephone Encounter (Signed)
We already have the report and CD that the patient dropped off last week while [facility] was out of the office. Have been waiting for [facility] to return to office to give to her for review.

## 2023-02-19 ENCOUNTER — Other Ambulatory Visit: Payer: Self-pay | Admitting: Physician Assistant

## 2023-02-19 DIAGNOSIS — M4802 Spinal stenosis, cervical region: Secondary | ICD-10-CM

## 2023-03-05 ENCOUNTER — Telehealth: Payer: Self-pay | Admitting: Physician Assistant

## 2023-03-05 NOTE — Telephone Encounter (Signed)
Pt came in to drop off MRI he would like a call to review unless he needs to come in to discuss treatment, please advise

## 2023-04-03 ENCOUNTER — Ambulatory Visit: Payer: BLUE CROSS/BLUE SHIELD | Admitting: Neurology

## 2023-04-09 ENCOUNTER — Ambulatory Visit: Payer: Self-pay | Admitting: Nurse Practitioner

## 2023-04-16 ENCOUNTER — Ambulatory Visit: Payer: Self-pay | Admitting: Nurse Practitioner

## 2023-04-16 ENCOUNTER — Telehealth: Payer: Self-pay | Admitting: Orthopedic Surgery

## 2023-04-16 NOTE — Telephone Encounter (Signed)
Chad Golden is calling to check on the status of the cervical spine MRI that was ordered for him last month.  His call back # is [phone].

## 2023-04-22 NOTE — Telephone Encounter (Signed)
Called Mr. Finck this a.m and he states he hasn't heard from anyone with [facility]

## 2023-06-03 ENCOUNTER — Telehealth: Payer: Self-pay

## 2023-06-03 NOTE — Telephone Encounter (Signed)
Patient understood that his insurance is out of network and prefers to cancel PV and Colonoscopy.  PV and Colon cancelled.

## 2023-06-04 ENCOUNTER — Telehealth: Payer: Self-pay

## 2023-06-04 NOTE — Telephone Encounter (Signed)
The GI provider that we have sent him to does not take his insurance. He will need his referral sent somewhere else.

## 2023-06-24 ENCOUNTER — Encounter: Payer: BLUE CROSS/BLUE SHIELD | Admitting: Internal Medicine
